# Patient Record
Sex: Female | Born: 1970 | Race: Asian | Hispanic: No | Marital: Single | Smoking: Never smoker
Health system: Southern US, Community
[De-identification: ages and names within clinical notes are randomized; demographics above are authoritative.]

## PROBLEM LIST (undated history)

## (undated) DIAGNOSIS — C569 Malignant neoplasm of unspecified ovary: Secondary | ICD-10-CM

## (undated) DIAGNOSIS — G43909 Migraine, unspecified, not intractable, without status migrainosus: Secondary | ICD-10-CM

## (undated) DIAGNOSIS — N858 Other specified noninflammatory disorders of uterus: Secondary | ICD-10-CM

## (undated) HISTORY — DX: Other specified noninflammatory disorders of uterus: N85.8

## (undated) HISTORY — PX: SALPINGECTOMY: SHX328

---

## 2008-01-03 ENCOUNTER — Ambulatory Visit: Payer: Self-pay | Admitting: *Deleted

## 2008-01-03 ENCOUNTER — Ambulatory Visit: Payer: Self-pay | Admitting: Internal Medicine

## 2008-01-04 ENCOUNTER — Ambulatory Visit (HOSPITAL_COMMUNITY): Admission: RE | Admit: 2008-01-04 | Discharge: 2008-01-04 | Payer: Self-pay | Admitting: Family Medicine

## 2008-01-12 ENCOUNTER — Ambulatory Visit (HOSPITAL_COMMUNITY): Admission: RE | Admit: 2008-01-12 | Discharge: 2008-01-12 | Payer: Self-pay | Admitting: Family Medicine

## 2008-02-12 ENCOUNTER — Ambulatory Visit: Payer: Self-pay | Admitting: Family Medicine

## 2008-02-12 LAB — CONVERTED CEMR LAB
AST: 12 units/L (ref 0–37)
Alkaline Phosphatase: 53 units/L (ref 39–117)
BUN: 13 mg/dL (ref 6–23)
Basophils Absolute: 0 10*3/uL (ref 0.0–0.1)
Basophils Relative: 1 % (ref 0–1)
Calcium: 9.1 mg/dL (ref 8.4–10.5)
Chloride: 104 meq/L (ref 96–112)
Creatinine, Ser: 0.69 mg/dL (ref 0.40–1.20)
Eosinophils Absolute: 0.1 10*3/uL (ref 0.0–0.7)
Eosinophils Relative: 2 % (ref 0–5)
HDL: 48 mg/dL (ref >39)
Hemoglobin: 11.8 g/dL — ABNORMAL LOW (ref 12.0–15.0)
MCHC: 31.6 g/dL (ref 30.0–36.0)
MCV: 84.2 fL (ref 78.0–100.0)
Monocytes Absolute: 0.3 10*3/uL (ref 0.1–1.0)
Monocytes Relative: 5 % (ref 3–12)
RBC: 4.44 M/uL (ref 3.87–5.11)
RDW: 14.5 % (ref 11.5–15.5)
TSH: 3.264 microintl units/mL (ref 0.350–4.50)
Total Bilirubin: 0.5 mg/dL (ref 0.3–1.2)
Total CHOL/HDL Ratio: 3.2
VLDL: 14 mg/dL (ref 0–40)

## 2008-02-23 ENCOUNTER — Ambulatory Visit: Payer: Self-pay | Admitting: Family Medicine

## 2008-02-23 ENCOUNTER — Encounter: Payer: Self-pay | Admitting: Family Medicine

## 2008-03-22 ENCOUNTER — Ambulatory Visit: Payer: Self-pay | Admitting: Obstetrics & Gynecology

## 2008-03-28 ENCOUNTER — Ambulatory Visit (HOSPITAL_COMMUNITY): Admission: RE | Admit: 2008-03-28 | Discharge: 2008-03-28 | Payer: Self-pay | Admitting: Obstetrics & Gynecology

## 2008-04-11 ENCOUNTER — Ambulatory Visit: Payer: Self-pay | Admitting: Obstetrics & Gynecology

## 2008-04-30 ENCOUNTER — Inpatient Hospital Stay (HOSPITAL_COMMUNITY): Admission: AD | Admit: 2008-04-30 | Discharge: 2008-05-02 | Payer: Self-pay | Admitting: Obstetrics & Gynecology

## 2008-04-30 ENCOUNTER — Ambulatory Visit: Payer: Self-pay | Admitting: Obstetrics & Gynecology

## 2008-04-30 ENCOUNTER — Encounter: Payer: Self-pay | Admitting: Obstetrics & Gynecology

## 2008-05-17 ENCOUNTER — Ambulatory Visit: Payer: Self-pay | Admitting: Obstetrics & Gynecology

## 2008-06-28 ENCOUNTER — Ambulatory Visit: Payer: Self-pay | Admitting: Obstetrics & Gynecology

## 2008-09-27 ENCOUNTER — Ambulatory Visit: Payer: Self-pay | Admitting: Obstetrics & Gynecology

## 2008-09-27 LAB — CONVERTED CEMR LAB: Chlamydia, Swab/Urine, PCR: NEGATIVE

## 2009-02-12 ENCOUNTER — Ambulatory Visit: Payer: Self-pay | Admitting: Internal Medicine

## 2009-08-07 ENCOUNTER — Ambulatory Visit: Payer: Self-pay | Admitting: Family Medicine

## 2009-08-07 IMAGING — US US RENAL
1 series · 14 of 24 positions shown · non-contrast
Comparison: None

CLINICAL DATA: Right flank pain.  Enlarged uterus.

RENAL/URINARY TRACT ULTRASOUND
TECHNIQUE: Complete ultrasound examination of the urinary tract
was performed including evaluation of the kidneys renal collecting
systems and urinary bladder.

[Series 1: unknown · 0.27mm/px · 14 of 24 slices shown]
[im 1/24]
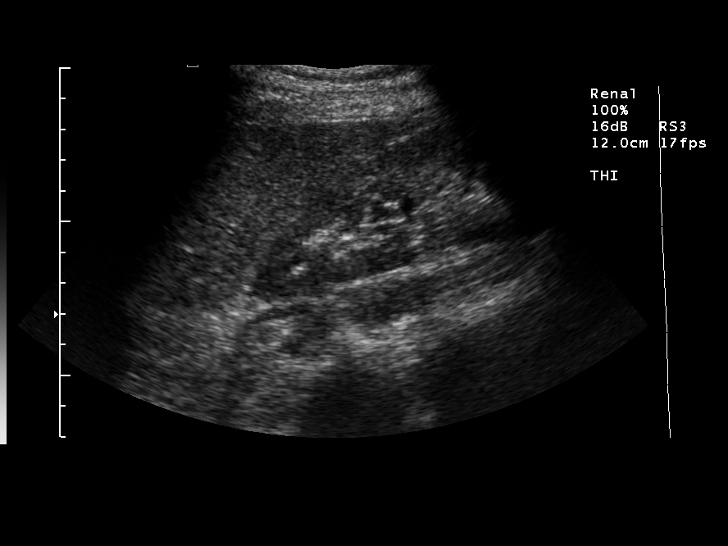
[im 3/24]
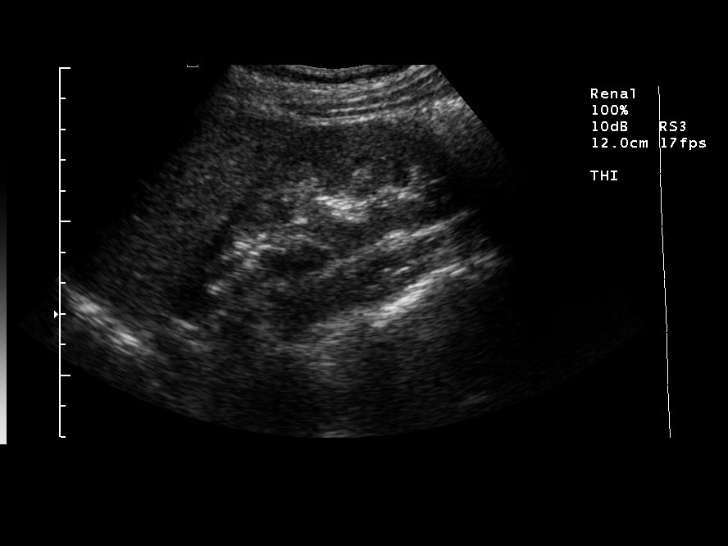
[im 5/24]
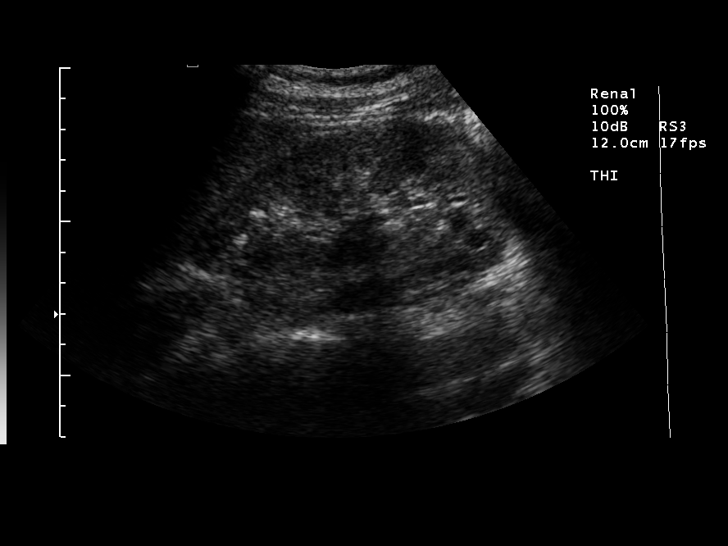
[im 7/24]
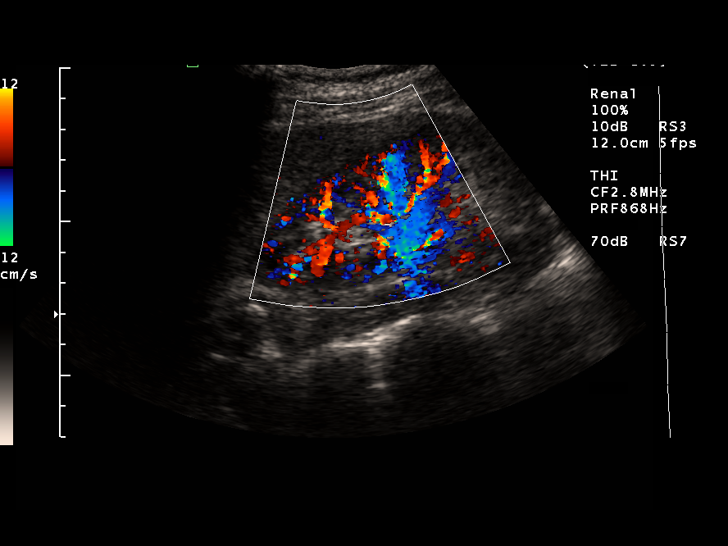
[im 8/24]
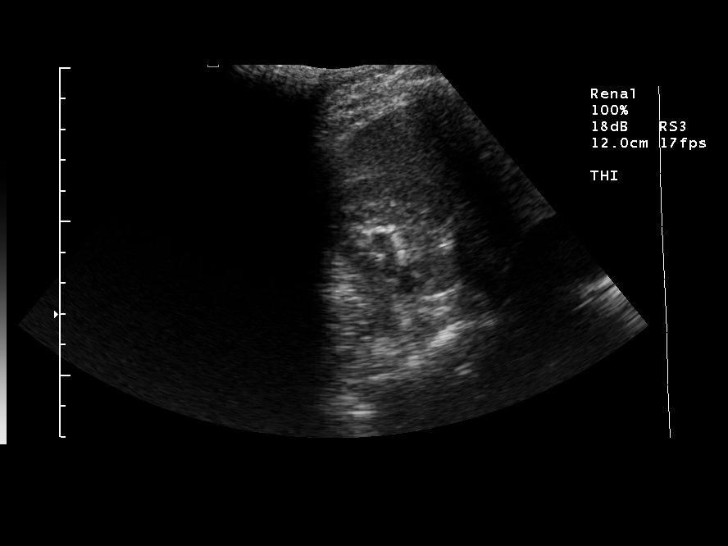
[im 10/24]
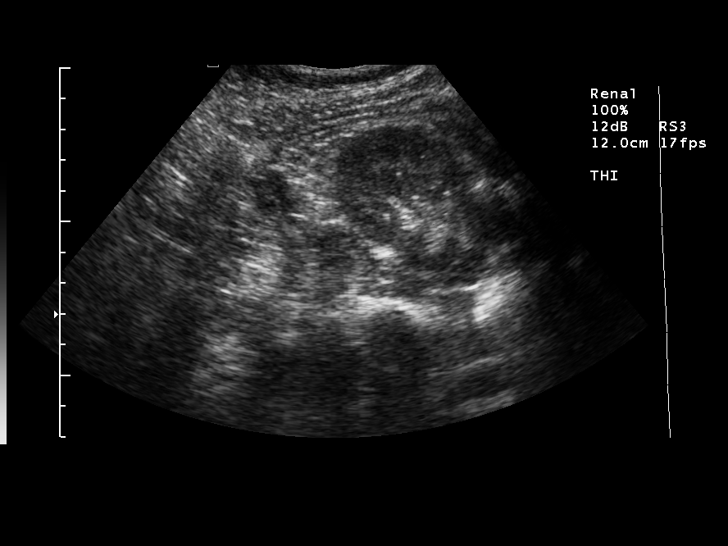
[im 12/24]
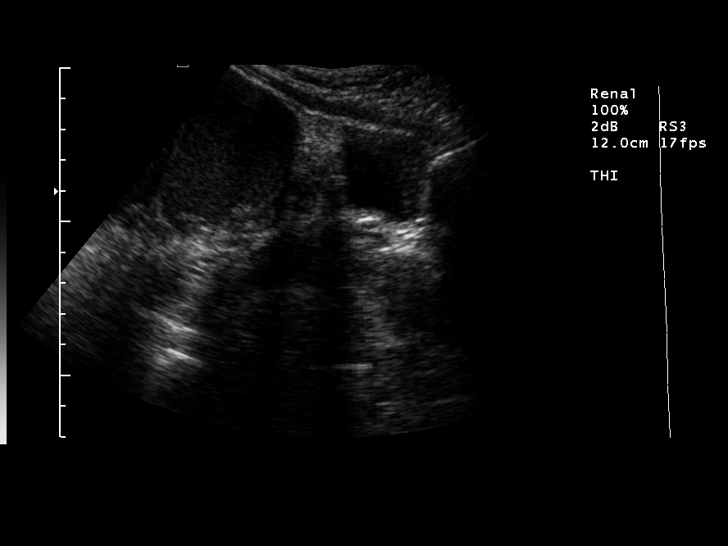
[im 13/24]
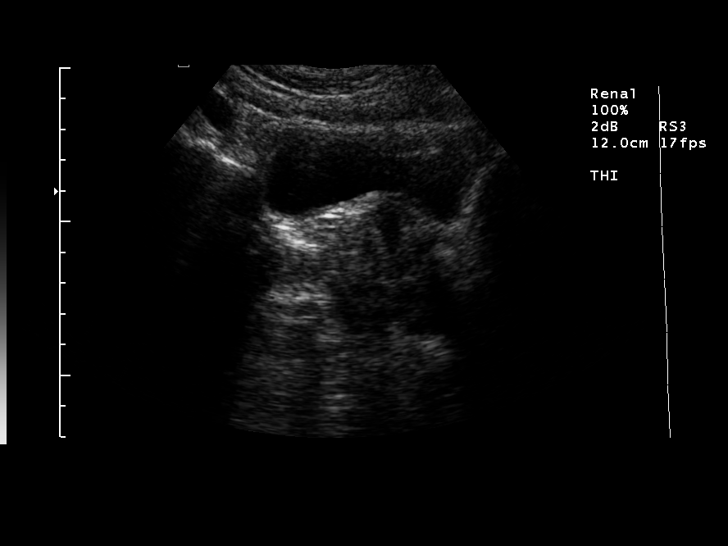
[im 15/24]
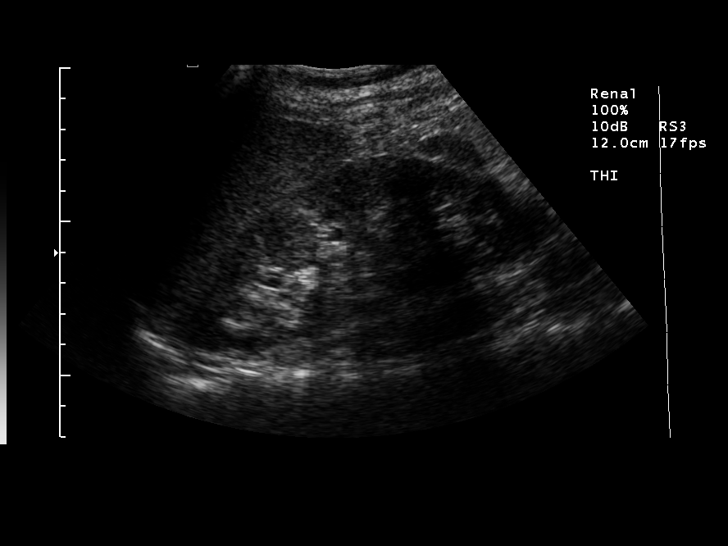
[im 17/24]
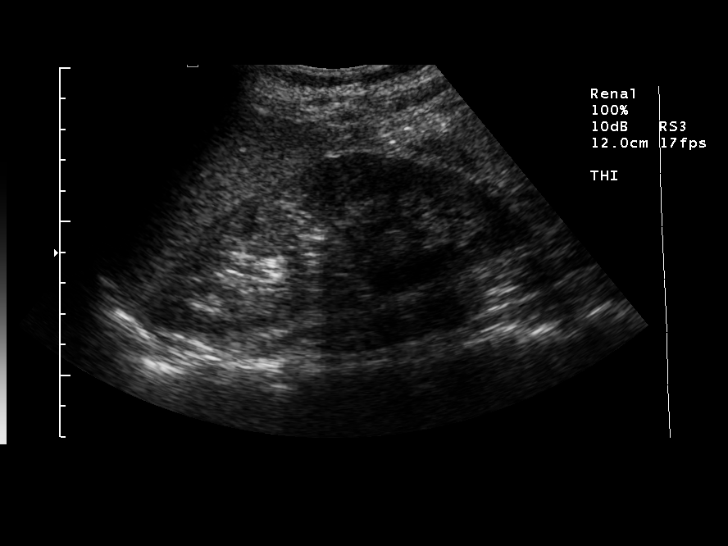
[im 19/24]
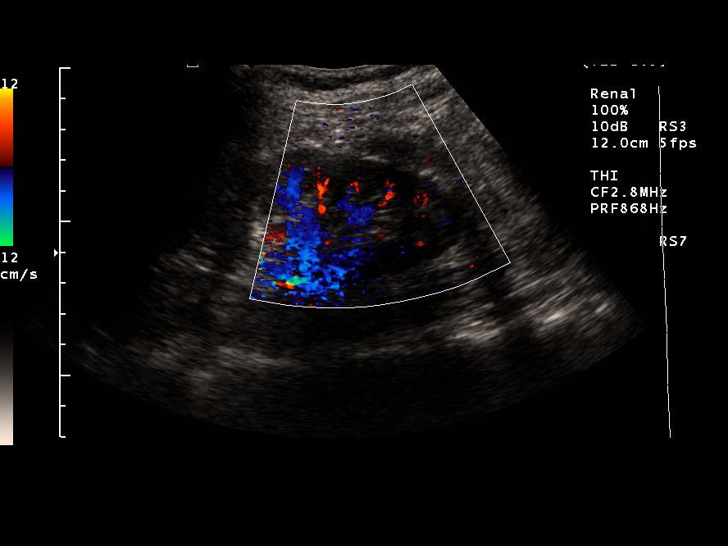
[im 20/24]
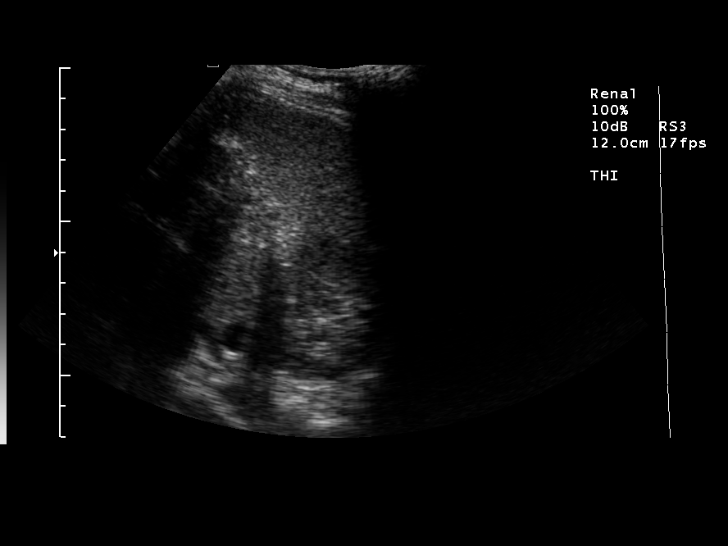
[im 22/24]
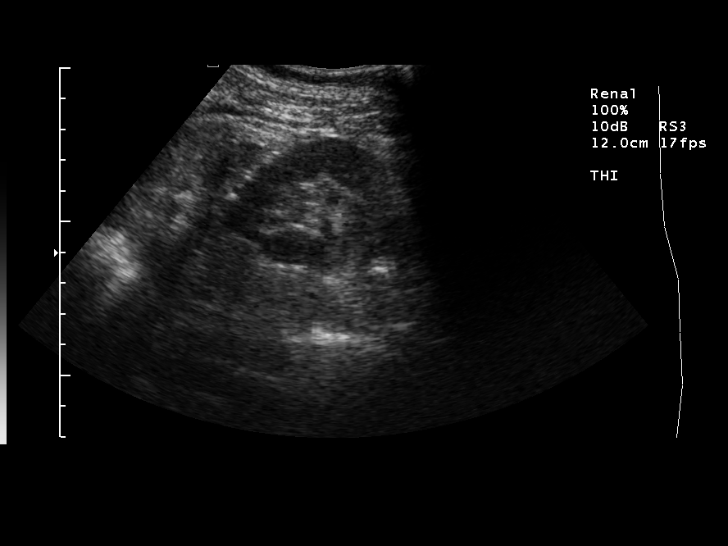
[im 24/24]
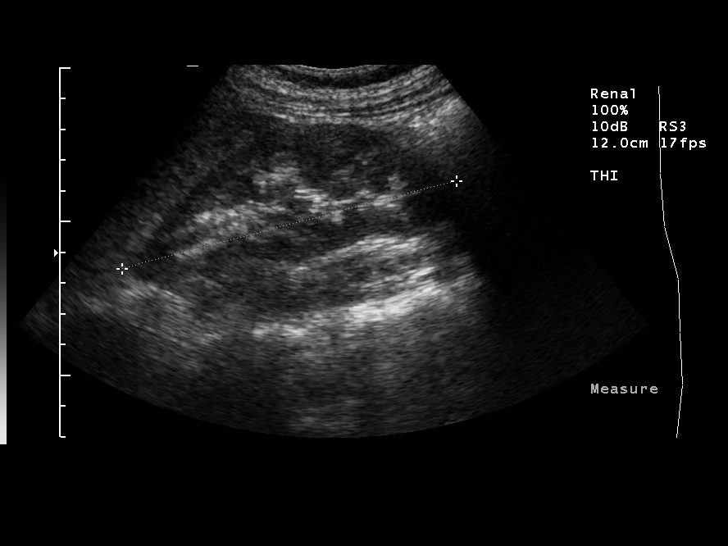

[14 of 24 positions shown; findings below may reference images not displayed]

FINDINGS: The right kidney measures 11.2 cm in length appears
normal.  There is no mass or hydronephrosis

The left kidney measures 12.6 cm in length and appears normal
without mass or hydronephrosis.  The renal cortex is normal.
Urinary bladder is partially filled and appears normal.
IMPRESSION: Negative

## 2009-08-07 IMAGING — US US PELVIS COMPLETE MODIFY
1 series · 13 of 25 positions shown · non-contrast
Comparison: None.

CLINICAL DATA: 36-year-old female with right flank pain and
clinical suspicion of enlarged uterus. LMP reported as 12/13/2007.

TRANSABDOMINAL AND TRANSVAGINAL ULTRASOUND OF PELVIS
TECHNIQUE: Both transabdominal and transvaginal ultrasound
examinations of the pelvis were performed including evaluation of
the uterus, ovaries, adnexal regions, and pelvic cul-de-sac.

[Series 1: unknown · 0.28mm/px · 13 of 86 slices shown]
[im 1/86]
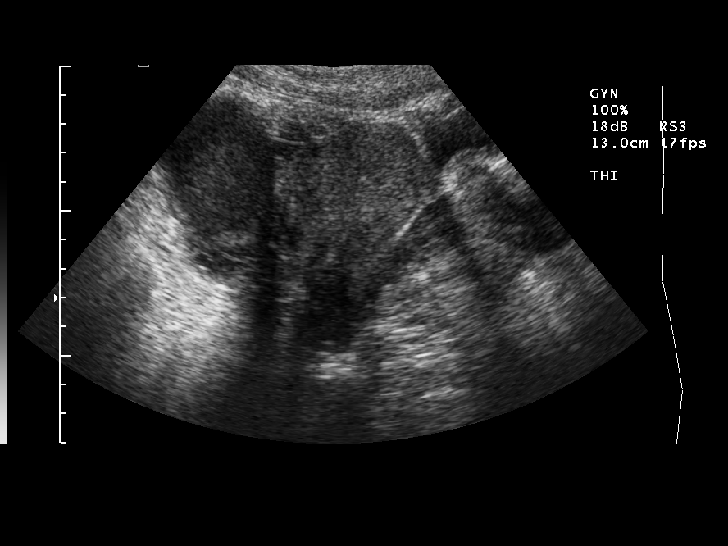
[im 8/86]
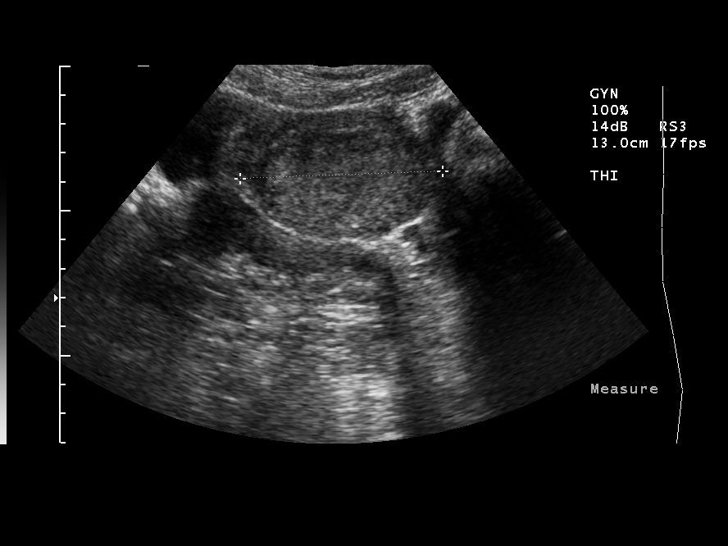
[im 15/86]
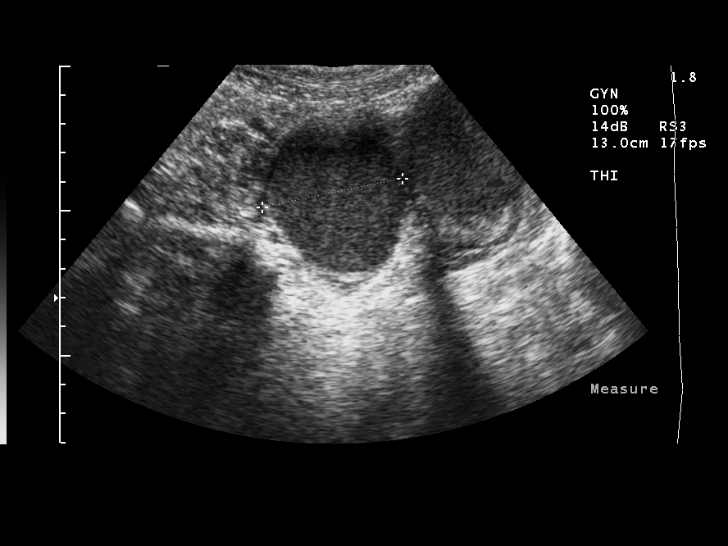
[im 22/86]
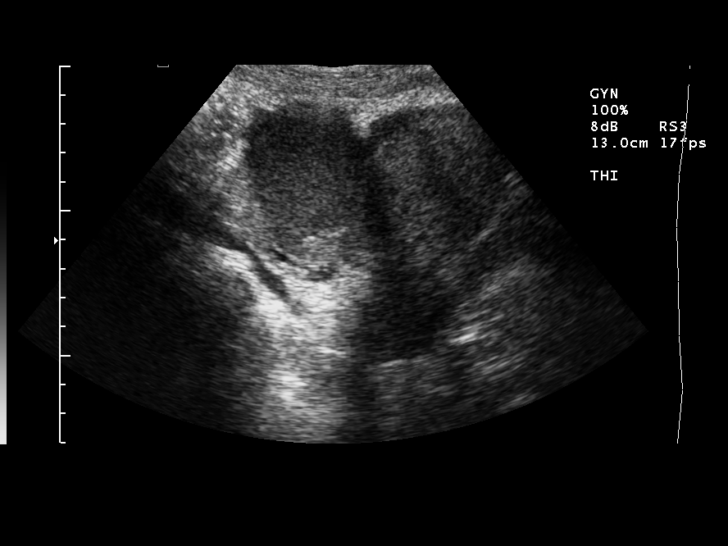
[im 29/86]
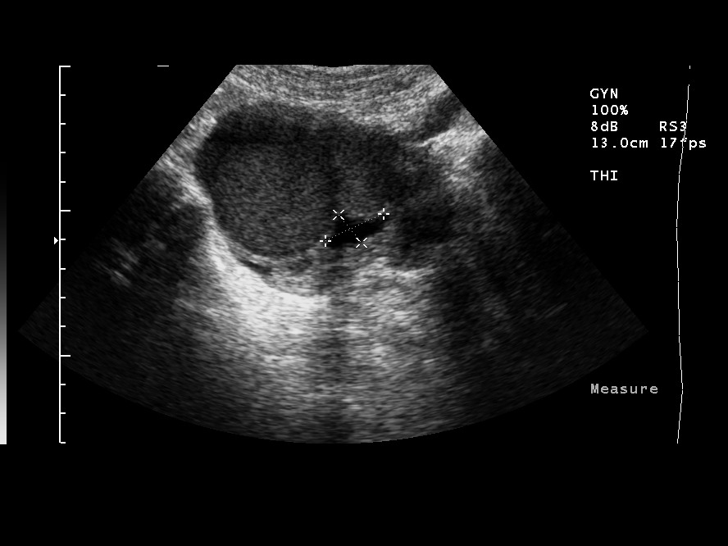
[im 36/86]
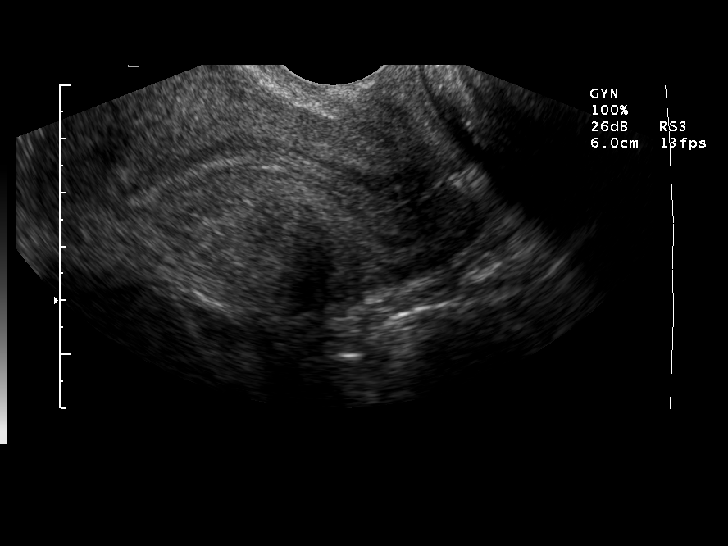
[im 43/86]
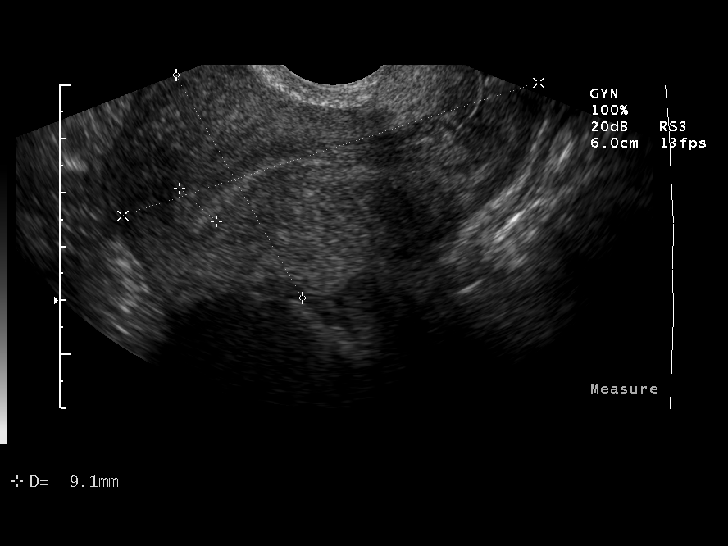
[im 50/86]
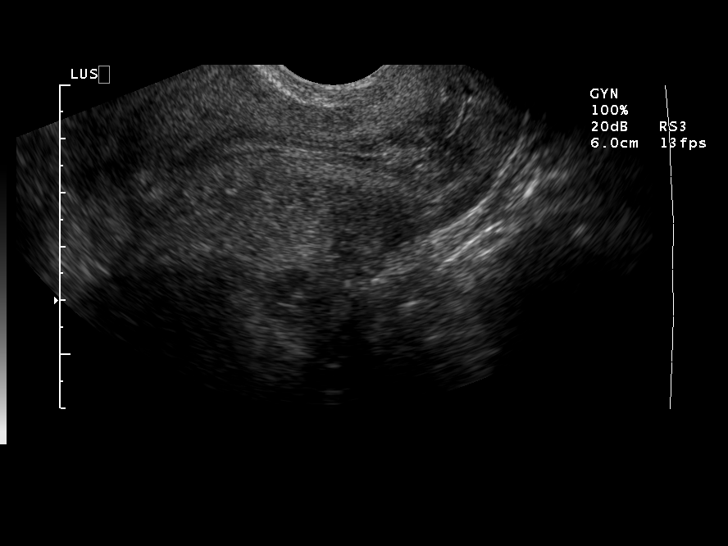
[im 57/86]
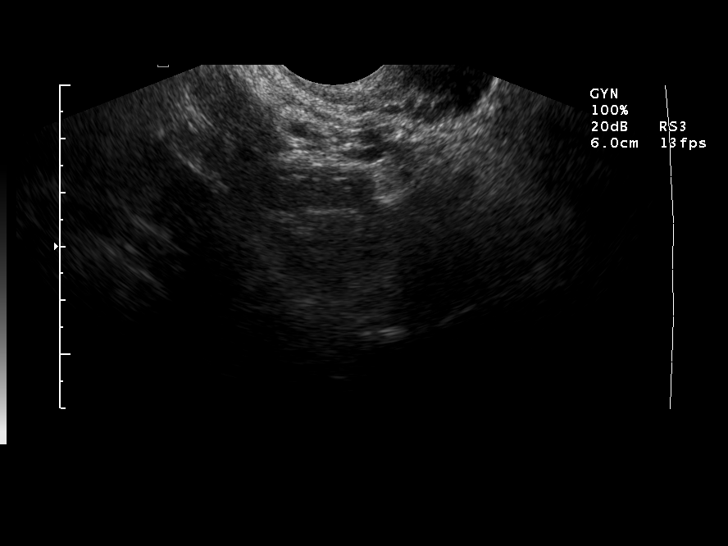
[im 64/86]
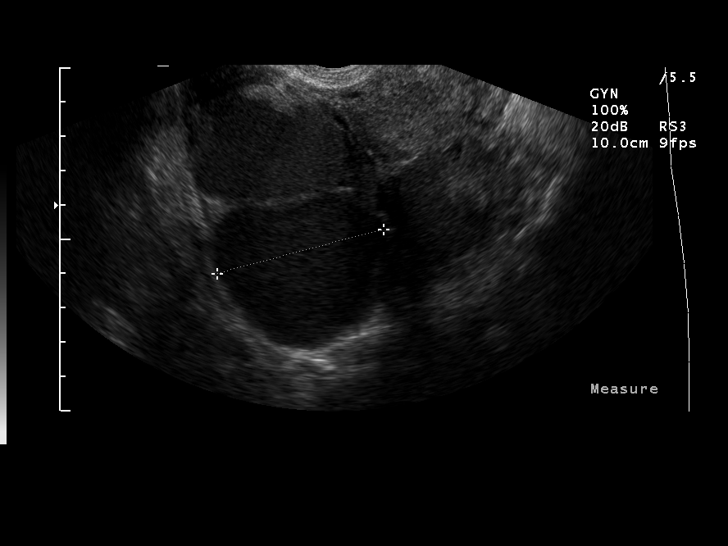
[im 71/86]
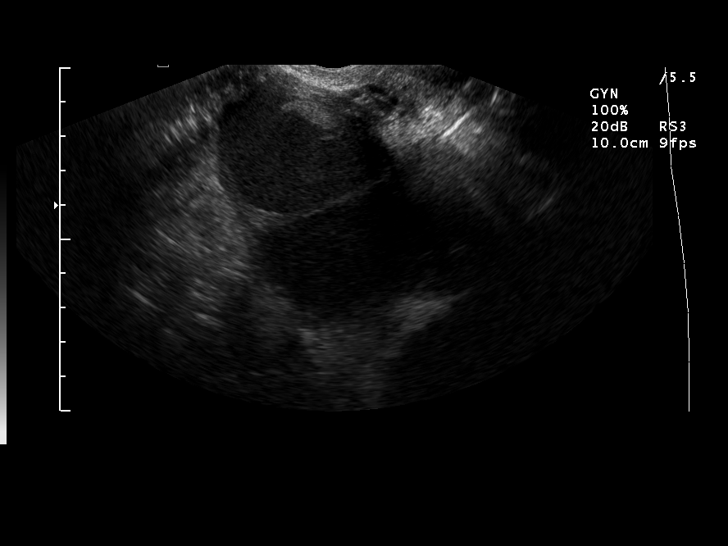
[im 78/86]
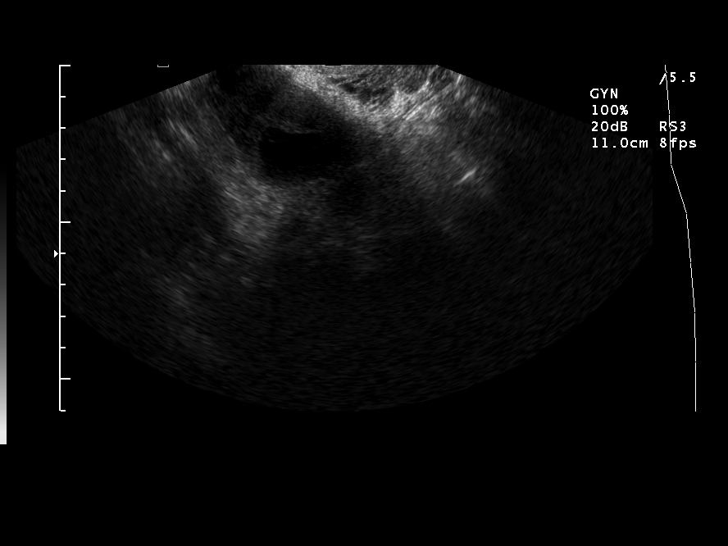
[im 86/86]
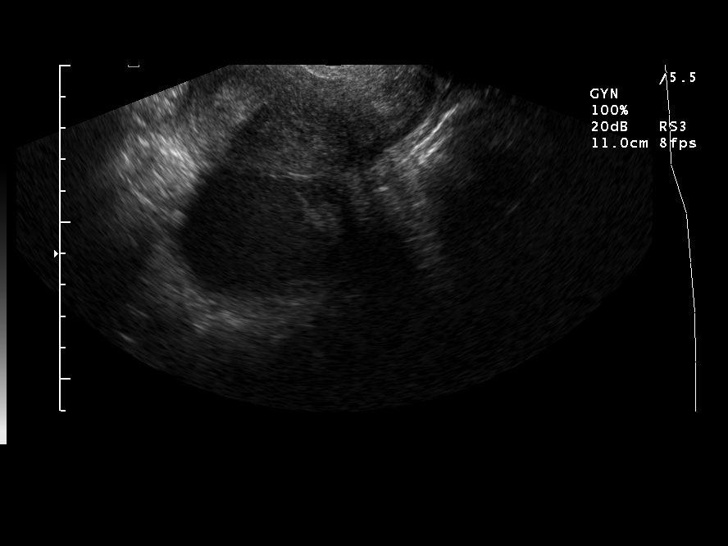

[13 of 25 positions shown; findings below may reference images not displayed]

FINDINGS: The uterus is not enlarged measuring 8.1 x 4.8 x 5.8 cm.
The myometrial echotexture is within normal limits.  The
endometrium is prominent and is seen to be thicker in the region of
the lower uterine segment than in the fundus (12 and 9 mm
respectively).

No definite free fluid identified.

The left adnexa is never delineated on transabdominal or
transvaginal scanning.

The right adnexa is very abnormal.  There are multiple rounded and
lobulated structures occupying the region of the right adnexa which
have velvety - intermediate level echotexture.  Altogether, these
measure 10.7 x 8 x 10.2 cm.  Individually, the largest of the three
fairly distinct round lesions measures 5.6 x 4 x 5.1 cm.  The
appearance is typical of chronic blood products such as is seen
with endometriomas.  However, the multilobulated appearance of
these lesions raises the possibility of involvement of the right
fallopian tube.  No normal adnexal tissue on the right is
identified.  There is a small trapezoidal shaped anechoic area
measuring 22 x 12 mm which could be some associated free fluid, or
compressed follicle. Also, there is a small curvilinear more soft
tissue appearing excrescence which is associated with the superior
most lesion.  This excrescence demonstrates no definite vascularity
on Doppler interrogation.  These masses likely correspond to the
palpable abnormality reported clinically.
IMPRESSION: 1.  Abnormal right adnexa.  The appearance is most suggestive of
multiple large endometriomas and/or endometriosis and dilatation of
the right fallopian tube.  Pelvis MRI with and without contrast (
high resolution female pelvis protocol) is recommended for further
characterization.
2.  Uterus within normal limits for age.
3.  Left adnexa is not identified.

## 2009-09-18 ENCOUNTER — Ambulatory Visit: Payer: Self-pay | Admitting: Internal Medicine

## 2010-12-01 NOTE — Op Note (Signed)
Ashley Reyes, Ashley Reyes            ACCOUNT NO.:  [id number]   MEDICAL RECORD NO.:  [id number]          PATIENT TYPE:  INP   LOCATION:  9311                          [facility]ILITY:  [facility]   PHYSICIAN:  Norton Blizzard, MD    DATE OF BIRTH:  [birth year]   DATE OF PROCEDURE:  04/30/2008  DATE OF DISCHARGE:                               OPERATIVE REPORT   PREOPERATIVE DIAGNOSES:  1. Chronic pelvic pain.  2. Persistent ovarian cysts bilaterally.   POSTOPERATIVE DIAGNOSES:  1. Chronic pelvic pain.  2. Persistent ovarian cysts bilaterally.  3. Benign ovarian cysts on frozen section.   PROCEDURES:  1. Diagnostic laparoscopy converted to exploratory laparotomy.  2. Bilateral salpingo-oophorectomy.  3. Lysis of adhesions.   SURGEON:  Norton Blizzard, MD   ASSISTANT:  Javier Glazier. Rose, MD   ANESTHESIA:  General.   IV FLUIDS:  1500 mL of lactated Ringer's.   ESTIMATED BLOOD LOSS:  100 mL.   URINE OUTPUT:  100 mL.   INDICATIONS:  The patient is a 41 year old gravida 2, para 2 who has had  persistent ovarian cysts bilaterally and severe pain with periods for  the left 6 years.  On an ultrasound and MRI, she was noted to have  significant pelvic pathology, which involved complex cysts measuring up  to 10 cm in size on her right side and up to 6 cm in size on her left  side with heterogenous components.  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  When she was initially seen in the beginning of  September, she declined surgery.  She reported using a homeopathic  therapy, which she hoped would cure her cyst, and her endometriosis.  When she came back for a followup visit in October, the patient was  ready to have surgery; she was told that given that the cysts have been  persistent for many years, there was a low risk of neoplasia; however,  this will not be known until pathologic examination. She was told that a  diagnostic laparoscopy will be done given the patient was very  interested in minimal invasive surgery; however, she was told that there  was a great chance of converting to laparotomy given the size of the  cysts noted on ultrasound.  The risks of surgery including bleeding,  infection, injury to surrounding organs, possible removal of both  ovaries, and possible laparotomy were discussed with the patient and  written informed consent was obtained.   FINDINGS:  Large bilateral complex cysts involving the entirety of both  ovaries.  These complex cysts were adherent to each other to the  bilateral pelvic sidewalls, posterior uterine surface, rectum, and  surrounding intestines.  No normal ovarian tissue was noted.  There was  diffuse endometriosis lesions that were noted in the pelvis especially  in the anterior and posterior cul-de-sacs.  Normal uterus;no uterine  lesions observed.  Frozen pathology impression was of benign ovarian  cysts, likely endometriomas.   SPECIMENS:  Bilateral fallopian tubes and ovaries.   DISPOSITION:  Specimens to Pathology.   COMPLICATIONS:  None.   PROCEDURE DETAILS:  The patient received  preoperative IV antibiotics and  had sequential compression devices placed on her lower extremities in  the preoperative area.  She was then taken to the operating room where  general anesthesia was administered and found to be adequate.  The  patient was then placed in a dorsal lithotomy position and prepped and  draped in a sterile manner.  A uterine manipulator was placed into the  patient's cervix and a Foley catheter was placed into the patient's  bladder and attached to constant gravity.  Attention was then turned to  the patient's abdomen where an infraumbilical incision was made with a  scalpel.  The Veress needle was then inserted into the peritoneum  through this incision and correct intraperitoneal placement was  confirmed with a low opening pressure of 4 mmHg upon insufflation.  The  abdomen was then insufflated with  carbon dioxide gas to a pressure of 15  mmHg.  At this point, a 10-mm trocar was then placed into the umbilical  incision into the peritoneum.  The laparoscope was then placed at this  point.  The pelvis was visualized and it was immediately noted that she  had large ovarian cysts bilaterally that were firmly adherent to each  other and to the uterus and surrounding bowel.  Given that no clear  planes could be delineated, the decision was made to convert to  laparotomy.  The abdomen was desufflated and all instruments were  removed from the patient's abdomen.  The fascial layer of the umbilical  incision was closed with 0 Vicryl, and the skin was closed with  Dermabond.  A Pfannenstiel incision was then made using a scalpel and  carried through to the underlying layer of fascia using electrocautery.  The fascia was incised in the midline and this incision was extended  bilaterally using electrocautery.  Kochers were placed in the superior  aspect of the fascial incision, and the rectus muscles were dissected  off bluntly and sharply.  A similar process was carried out on the  inferior aspect.  The rectus muscles were separated in the midline  bluntly, and the peritoneum was entered bluntly.  This peritoneal  incision was extended superiorly and inferiorly with good visualization  of the bowel and bladder.  Peritoneal washings were performed.  Attention was then turned to the patient's ovarian cysts which were  noted at this point to be very adherent to the bowel and to the uterine  posterior wall.  An attempt was made to lyse the adhesions bluntly and  both cysts were noted to rupture and a llarge amount of chocolate-  colored thick fluid was extruded into the abdomen.  The cysts were noted  to be deflated after rupture and it was easier to use blunt and sharp  dissection to get the adhesions down in between the cysts, and also from  the posterior uterine wall.  Both ovaries were carefully  examined, and  there was no residual normal tissue that was noted.  There was also a  significant amount of endometriosis involving both ovaries, posterior  and anterior cul-de-sac, pelvic sidewalls, including significant  adhesions to bowel.  The decision at this point was made to proceed with  bilateral salpingo-oophorectomy.  A clear hole was made in the broad  ligament underneath the utero-ovarian ligament and fallopian tube, and  this pedicle was clamped, cut, and doubly suture ligated bilaterally.  A  similar process was carried out on the infundibulopelvic ligaments  bilaterally, which yielded bilateral salpingo-oophorectomy.  Both  specimens were sent to frozen section for analysis.  There was a small  amount of surface bleeding from the area that the cysts were adherent to  the uterus, and this was controlled using electrocautery.  Of note, the  rectum and bowel was also noted to be firmly adherent to the posterior  uterine wall.  There was an attempt to lyse these adhesions bluntly, but  this was not successful.  Of note, the decision was made to leave the  uterus in place due to the fact that the patient was not consented for  hysterectomy and there was no preoperative discussion regarding future  fertility plans.  Th thought was that the uterus will be left in place  in case the patient desired future fertility, and she could be a  candidate for in vitro fertilization.  Furthermore,  the risk of rectal  or bowel injury with hysterectomy at this time was noted to be very  significant.  The uterus and the densely adherent bowel were left in  place.  The abdomen was copiously irrigated with normal saline and  hemostasis was confirmed on all pedicles.  The posterior surface of the  uterus continued to have diffuse punctate bleeding from the area  involved in the adhesion lysis; this was controlled by placing Surgicel  on the site, also a sheet of Interceed was also placed as an  adhesion  barrier.  Overall, good hemostasis was noted.  At this point, the report  from the frozen pathology returned with the impression of benign ovarian  cysts, likely endometriomas.  The peritoneum was then reapproximated  using 0 Vicryl in a running stitch, and the fascia was closed using 0  Vicryl also in a running stitch.  The skin was closed with staples.  The  patient tolerated the procedure well.  Sponge, needle, and instrument  counts were correct x3.  She was taken to the recovery room awake,  extubated, and in stable condition.      Norton Blizzard, MD  Electronically Signed     UAD/MEDQ  D:  04/30/2008  T:  05/01/2008  Job:  [id number]
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
## 2010-12-01 NOTE — Group Therapy Note (Signed)
NAME:  Ashley Reyes, DYKMAN NO.:  [id number]   MEDICAL RECORD NO.:  [id number]          PATIENT TYPE:  WOC   LOCATION:  [facility]                   FACILITY:  [facility]   PHYSICIAN:  Johnella Moloney, MD        DATE OF BIRTH:  12/26/1970   DATE OF SERVICE:                                  CLINIC NOTE   HISTORY:  The patient is a 40 year old para 2 who was last seen by Dr.  Elsie Lincoln on March 22, 2008 for evaluation of complex ovarian  cyst.  The patient had an MRI that showed  complex cyst bilaterally  measuring up to 10 cm in size on her right ovary and up to 6 cm in size  on the left ovary.  The patient had an ultrasound given that this MRI  was done in June and she had an ultrasound to document any interval  change.  She is here to follow up results.  The patient does not have  any pain or any other symptoms.   Ultrasound was remarkable for 3 large hypoechoic masses within the  pelvis, 2 on the right and 1 on the left, and do not appear  significantly changed in size measuring 9.9 x 8.9 x 5.7 in overall  dimension.  No free pelvic fluid.  Endometrial stripe is homogeneous  within normal limits measuring 9 mm and normal uterus.  These results  were discussed with the patient.  Given the persistence of the cysts,  the patient is interested in getting surgery.  The risks of surgery were  explained to the patient including bleeding, infection, injury  surrounding organs, possible removal of ovaries and possible laparotomy.  She was told that the mode of surgery would be operative laparoscopy,  bilateral ovarian cystectomy with possible oophorectomy and possible  laparotomy.  She was also told about anesthesia complications which will  be explained in detail by the anesthesiologist.   All questions were answered and the patient will meet with the surgical  scheduler at the end of this visit for surgery scheduling.  The patient  has a comprehensive history and physical  that was taken by Dr. Elsie Lincoln on March 22, 2008.  She does not have any significant medical  or surgical history.           ______________________________  Johnella Moloney, MD     UD/MEDQ  D:  04/11/2008  T:  04/12/2008  Job:  [id number]

## 2010-12-01 NOTE — Group Therapy Note (Signed)
NAME:  Ashley Reyes, Ashley Reyes NO.:  [id number]   MEDICAL RECORD NO.:  [id number]          PATIENT TYPE:  WOC   LOCATION:  [facility]                   [facility]AC[facility]   PHYSICIAN:  Johnella Moloney, MD        DATE OF BIRTH:  07-17-1971   DATE OF SERVICE:  05/17/2008                                  CLINIC NOTE   The patient is a 40 year old para 2 who underwent an exploratory  laparotomy, bilateral salpingo-oophorectomy, and lysis of adhesions for  large endometriomas and adhesions sustained as a result of severe  endometriosis.  The patient had an uncomplicated surgery and  postoperative course.  She is here today for her 2-week followup.  The  patient reports markedly improved pelvic pain; however, she does report  having heavier bleeding that started last night.  Of note, the patient  received a dose of Depo-Provera during her hospital day for treatment of  her endometriosis.  She also reports that she has been itching since  surgery and reports pain in her jaw and left ear that that it is  difficult to eat.  She also reports having sores in her mouth and she  has lost 6 pounds since surgery.  The patient denies any fevers, chills,  sweats, genitourinary, or gastrointestinal symptoms.   PHYSICAL EXAMINATION:  Temperature 98.7, pulse 73, blood pressure  106/68, weight 110 pounds, height 5 feet 1-3/4 inches.  In general, in  no apparent distress.  Abdomen is soft, nontender, a well-healed  Pfannenstiel incision.  Pelvic examination is deferred.   ASSESSMENT AND PLAN:  The patient is a 40 year old para 2 here for  postoperative check.  The patient is doing well.  She was told that her  bleeding is likely due to estrogen withdrawal given that her ovaries  have been removed.  She was told that hormone replacement therapy will  be initiated at her next visit in the form of probably low dose oral  contraceptive pills given her age.  As for the itching, the patient does  report  taking Percocet and has been taking since surgery.  She was told  that Percocet does have pruritus as the common side effect.  She was  told to take ibuprofen and Tylenol as needed for pain and see if this  will be enough for her pain.  As for her complaints of pain in her jaw  and left ear and mouth sores, the patient was told to follow up with her  PCP for evaluation of these problems, as this is not usual side effect  of the medication she is on or the surgery that she underwent.  The  patient will have a followup appointment in the next 4-5 weeks for her  postoperative evaluation and also initiation of hormone replacement  therapy.           ______________________________  Johnella Moloney, MD     UD/MEDQ  D:  05/17/2008  T:  05/17/2008  Job:  [id number]

## 2010-12-01 NOTE — Group Therapy Note (Signed)
NAME:  DOLORAS, TELLADO NO.:  [id number]   MEDICAL RECORD NO.:  [id number]          PATIENT TYPE:  WOC   LOCATION:  [facility]                   [facility]AC[facility]   PHYSICIAN:  Allie Bossier, MD        DATE OF BIRTH:  Sep 03, 1970   DATE OF SERVICE:  09/27/2008                                  CLINIC NOTE   IDENTIFICATION:  Ashley Reyes is a 40 year old single lady, who is G2, P2,  who is from [place], she has been here for 4 years.  She had an  exploratory laparotomy and BSO and lysis of adhesions on April 30, 2008, for endometrial illness.  Her uterus was left in situ.  She was  placed on Loestrin on June 28, 2008, for hormone replacement  therapy.  She states that she has been bleeding essentially every day  since August 19, 2008, mostly she states it is a brownish discharge.  She has no other complaints.  Her vital signs are stable.  She is  afebrile.  On exam, she has old brownish discharge in her vault.  Her  cervix appears normal.  There is no polyps or lesions.  Otherwise, the  discharge is normal.   ASSESSMENT AND PLAN:  Dysfunctional uterine bleeding on Loestrin.  I  have checked cervical cultures and a TSH today.  In addition, I have  changed her oral contraceptive pills to Lo/Ovral.  She will come back in  3 months if this pill does not alleviate her symptoms.      Allie Bossier, MD     MCD/MEDQ  D:  09/27/2008  T:  09/27/2008  Job:  [id number]

## 2010-12-01 NOTE — Discharge Summary (Signed)
Ashley Reyes, Ashley Reyes            ACCOUNT NO.:  [id number]   MEDICAL RECORD NO.:  [id number]          PATIENT TYPE:  INP   LOCATION:  9311                          [facility]ACILITY:  [facility]   PHYSICIAN:  Norton Blizzard, MD    DATE OF BIRTH:  06/12/1971   DATE OF ADMISSION:  04/30/2008  DATE OF DISCHARGE:  05/02/2008                               DISCHARGE SUMMARY   ADMISSION DIAGNOSES:  Chronic pelvic pain, persistent bilateral ovarian  cyst.   DISCHARGE DIAGNOSES:  Status post removal of both ovaries and lysis of  adhesions for endometriosis.   PROCEDURES:  Diagnostic laparoscopy converted to exploratory laparotomy,  bilateral salpingo-oophorectomy, and lysis of adhesions, April 30, 2008.   BRIEF HOSPITAL COURSE:  The patient is a 40 year old gravida 2, para 2  with a long history of chronic pelvic pain and persistent bilateral  ovarian cysts.  The patient was initially scheduled for an operative  laparoscopy given that she wanted a minimally invasive approach;  however, upon laparoscopy, it was noted that she had large ovarian cysts  bilaterally, multiple adhesions to bowel, and to the uterus, and  significant evidence of endometriosis and so the decision was made to  convert to an exploratory laparotomy for further management.  She ended  up having a bilateral salpingo-oophorectomy, extensive enterolysis and  lysis of adhesions.  For further details of this operation, please refer  to the separate dictated operative report.  The patient had an  uncomplicated postoperative course.  Of note, on postoperative day #1,  the patient received the dose of Depo-Provera 300 mg IM  to help in  treatment of her endometriosis. By postoperative day #2, her pain was  controlled on oral pain medications.  She was tolerating her regular  diet, ambulating without difficulty, voiding without difficulty, and  also had a bowel movement.  Her incisional staples were removed and  Steri-Strips placed with  Benzoin were placed. The patient was deemed  stable for discharge to home.   DISCHARGE CONDITION:  Stable.   DISCHARGE MEDICATIONS:  1. Percocet 5/325 one to two tablets p.o. q.6 h p.r.n. pain.  2. Ibuprofen 600 mg p.o. q.6 h p.r.n. pain.  3. Colace 100 mg p.o. b.i.d. p.r.n. constipation.   DISCHARGE INSTRUCTIONS:  The patient was told to avoid lifting anything  for 6 weeks.  She was told also to avoid driving while she is on  narcotic pain medications.  She was a given the routine postoperative  discharge instruction sheet that addressed activity level and diet.  The  patient has a scheduled follow-up appointment in the GYN clinic on  May 17, 2008, at 10:45 a.m. and this information was communicated to  the patient.      Norton Blizzard, MD  Electronically Signed    UAD/MEDQ  D:  05/02/2008  T:  05/03/2008  Job:  [id number]

## 2010-12-01 NOTE — Group Therapy Note (Signed)
NAME:  Ashley Reyes, Ashley Reyes NO.:  [id number]   MEDICAL RECORD NO.:  [id number]          PATIENT TYPE:  WOC   LOCATION:  [facility]                   FACILITY:  [facility]   PHYSICIAN:  Johnella Moloney, MD        DATE OF BIRTH:  [birth year]   DATE OF SERVICE:                                  CLINIC NOTE   The patient is a 40 year old para 2 who underwent an exploratory  laparotomy, bilateral salpingo-oophorectomy, and lysis of adhesions for  bilateral endometriosis and endometriomas.  The patient is here for her  6-week followup.  She had an uncomplicated surgery course.  She does  report having some pain around the right side of her incision, but no  other symptoms.   PHYSICAL EXAMINATION:  The patient is afebrile.  Vital signs are stable.  Her incision is clean and intact and has healed very well.  Abdomen is  soft, nontender.  Pelvic examination is deferred.   ASSESSMENT AND PLAN:  The patient is a 40 year old para 2 here for her 6-  week postoperative evaluation given that the patient had a bilateral  salpingo-oophorectomy.  She was told that she will need hormone  replacement therapy.  She still has her uterus in place so she would  need estrogen and progesterone.  The patient was given samples for  Loestrin 24 Fe to start as part of her hormone replacement therapy.  She  was instructed to take active pills of this.  The patient was told that  she would need to be on hormone replacement therapy until age 66 and  then she will start to be weaned off therapy at this point.  The patient  will come back in 3 months for OCPM blood pressure surveillance.  As for  her incisional pain on examination, no abnormalities were found and is  likely musculoskeletal or also nerve entrapment is a possibility.  She  says that her pain is not alleviated by ibuprofen given the nature of  her pain, suggested Flexeril to see if this will help with her pain and  a prescription for Flexeril was  given to the patient.  She was told to  call if her pain does not get better.  The patient will be going back to  work soon and she was told to come back for any further problems.  Her  annual exam is due in August 2010.           ______________________________  Johnella Moloney, MD     UD/MEDQ  D:  06/28/2008  T:  06/29/2008  Job:  [id number]

## 2011-01-21 ENCOUNTER — Ambulatory Visit (INDEPENDENT_AMBULATORY_CARE_PROVIDER_SITE_OTHER): Payer: Self-pay | Admitting: Family Medicine

## 2011-01-21 DIAGNOSIS — N920 Excessive and frequent menstruation with regular cycle: Secondary | ICD-10-CM

## 2011-01-21 DIAGNOSIS — N939 Abnormal uterine and vaginal bleeding, unspecified: Secondary | ICD-10-CM | POA: Insufficient documentation

## 2011-01-22 NOTE — Group Therapy Note (Signed)
NAMEJANNATUL, Ashley Reyes NO.:  [id number]  MEDICAL RECORD NO.:  [id number]           PATIENT TYPE:  A  LOCATION:  [facility]                   [facility]ACILITY:  [facility]  PHYSICIAN:  Tinnie Gens, MD        DATE OF BIRTH:  1971-06-11  DATE OF SERVICE:  01/21/2011                                 CLINIC NOTE  CHIEF COMPLAINT:  Abnormal uterine bleeding.  HISTORY OF PRESENT ILLNESS:  The patient is a 40 year old para 2 who was seen today for abnormal uterine bleeding.  She has a very complicated history that involved a diagnostic laparoscopy with subsequent bilateral salpingo-oophorectomy without hysterectomy secondary to endometriomas. After hysterectomy, she was surgically menopausal.  Because her hot flashes were so bad she was placed on a low-dose oral contraceptive pill.  She is having trouble with this low-dose oral contraceptive pill, however, in that it causes her frank bleeding and she happens to miss a dose.  She is interested today in endometrial ablation.  PHYSICAL EXAMINATION:  VITAL SIGNS:  Her vitals are as noted in the chart. GENERAL:  She is a well-developed, well nourished female in no acute distress. ABDOMEN:  Soft, nontender, and nondistended.  IMPRESSION:  Abnormal uterine bleeding on low-dose oral contraceptive pills for hormone replacement therapy in a patient who has no ovaries but has an intact uterus.  A very lengthy discussion was held with the patient including pictures and explanations regarding the fact that she is menopausal.  If she were to stop her birth control pills, she would have any cycles at all.  However, her hot flashes would return and she had been menopausal in her late 67s which would be early for her. Endometrial ablation is not an option for this patient or could be an option for this patient.  However, if we fail to get a certain spot on her endometrial lining, we could still have precancer or cancerous changes if she was on  unopposed estrogen alone but she would need for control hot flashes, which leads [place] back with putting her on something that contains progesterone as well if she will need for protection of her uterine lining.  I suspecting that it is the low dose of hormones that is causing her uterine bleeding issues and that changing her to the regular 135 pill, we hopefully ameliorate this problem.  An alternative would be to place a Mirena IUD which will protect her uterine lining and then stop her birth control pills altogether and put her on hormone replacement, probably of a transdermal route.  But for now, we are going to start with birth control change.  She needs one to achieve anyway, so we will put her on a generic Ovcon 135.  The plan will be for her to follow up in 2 months to see how this is working for her with the aforementioned alternatives as outlined as options if this fails.          ______________________________ Tinnie Gens, MD    TP/MEDQ  D:  01/21/2011  T:  01/22/2011  Job:  [id number]  cc:   Georganna Skeans, MD Annamary Carolin

## 2011-02-16 ENCOUNTER — Telehealth: Payer: Self-pay | Admitting: *Deleted

## 2011-02-16 NOTE — Telephone Encounter (Signed)
Spoke w/pt who stated that she has been having pain for 3 wks in her abd and back. The pain is mostly on her Rt side and goes to her back and butt- feels like stabbing pain. She started her new OCP's 2 wks ago and has not yet noticed a change in her pain. I told her that it can take some time. She has been taking ibuprofen 600 mg q6h and says that the pain is relieved only for a couple hours. I advised pt to alternate tylenol 2 tabs q6h with ibuprofen so that she is taking some med every 3 hrs. I recommended that she try this for 24 hrs. If she is not satisfied with her pain relief, she should go to [facility] for evaluation. Pt states that she was told to get follow up appt with Dr. Shawnie Pons in Sept. I told pt that she may call back for that appt to be scheduled. Pt voiced understanding of all advice and info.

## 2011-02-19 ENCOUNTER — Inpatient Hospital Stay (HOSPITAL_COMMUNITY): Payer: Self-pay

## 2011-02-19 ENCOUNTER — Encounter (HOSPITAL_COMMUNITY): Payer: Self-pay | Admitting: *Deleted

## 2011-02-19 ENCOUNTER — Inpatient Hospital Stay (HOSPITAL_COMMUNITY)
Admission: AD | Admit: 2011-02-19 | Discharge: 2011-02-19 | Disposition: A | Discharge status: Home / Self Care | Payer: Self-pay | Source: Ambulatory Visit | Attending: Obstetrics and Gynecology | Admitting: Obstetrics and Gynecology

## 2011-02-19 DIAGNOSIS — R109 Unspecified abdominal pain: Secondary | ICD-10-CM

## 2011-02-19 DIAGNOSIS — M545 Low back pain, unspecified: Secondary | ICD-10-CM | POA: Insufficient documentation

## 2011-02-19 DIAGNOSIS — R1031 Right lower quadrant pain: Secondary | ICD-10-CM | POA: Insufficient documentation

## 2011-02-19 HISTORY — DX: Migraine, unspecified, not intractable, without status migrainosus: G43.909

## 2011-02-19 HISTORY — DX: Malignant neoplasm of unspecified ovary: C56.9

## 2011-02-19 LAB — CBC
MCH: 28 pg (ref 26.0–34.0)
MCHC: 33.9 g/dL (ref 30.0–36.0)
Platelets: 217 10*3/uL (ref 150–400)
RBC: 4.5 MIL/uL (ref 3.87–5.11)

## 2011-02-19 LAB — URINALYSIS, ROUTINE W REFLEX MICROSCOPIC
Glucose, UA: NEGATIVE mg/dL
Leukocytes, UA: NEGATIVE
Protein, ur: NEGATIVE mg/dL
Specific Gravity, Urine: 1.025 (ref 1.005–1.030)
Urobilinogen, UA: 0.2 mg/dL (ref 0.0–1.0)

## 2011-02-19 LAB — URINE MICROSCOPIC-ADD ON

## 2011-02-19 MED ORDER — DICLOFENAC SODIUM 75 MG PO TBEC: 75.0000 mg | DELAYED_RELEASE_TABLET | Freq: Two times a day (BID) | ORAL | Status: AC

## 2011-02-19 NOTE — Progress Notes (Signed)
Pt states she has had lower abdominal pain that radiates to the right side of her back for 3 weeks. Pt has been seen by Dr. Shawnie Pons at the [facility]. Has had a bilateral oopherectomy .

## 2011-02-19 NOTE — ED Provider Notes (Signed)
S:  Ashley Reyes is a 40 y.o. [id number] presenting with RLQ abd pain described as "like contractions" radiating to rt low back. Onset was 02/01/11 and she has relief from her regimen of Tyl 500 alternating with ibuprofen 600 q3h, but 2-3 hr after taking the med the pain returns. SHe notes more pain at rest than when moving around. She continues to work qd and does not want to take anything sedating for her pain, just concerned about having an explanation for it. She is s/p BSO for endometriomas and was put on low dose OCP, then referred to GYN CLinic by [facility] due to VB. On 7/5//12 she was seen by Dr. Shawnie Pons and switched to continuous 1/35 OCPs. She went off the low dose pills for a week before starting the 1/35 and is into the first pack and has not bled since 01/29/11. Denies abnl vag discharge, dysparunia and declines STI testing. No urinary sx or hx of musculoskeletal strain. Nl. BMs.  No N/V/fever. Good appetite.   O: Filed Vitals:   02/19/11 1545  BP: 134/72  Pulse: 75  Temp:   Resp: 18   Gen: NAD Abd: soft, flat, BS normal, healed Pfannensteil incision, minimal RLQ tenderness, no guarding or rebound  Spec: NEFG, mucusy, blood inged discharge. Parous os clean. Bimanual: no CMT, Ut NSSP, NT  Results for orders placed during the hospital encounter of 02/19/11 (from the past 24 hour(s))  URINALYSIS, ROUTINE W REFLEX MICROSCOPIC     Status: Abnormal   Collection Time   02/19/11 11:40 AM      Component Value Range   Color, Urine YELLOW  YELLOW    Appearance CLEAR  CLEAR    Specific Gravity, Urine 1.025  1.005 - 1.030    pH 6.0  5.0 - 8.0    Glucose, UA NEGATIVE  NEGATIVE (mg/dL)   Hgb urine dipstick TRACE (*) NEGATIVE    Bilirubin Urine NEGATIVE  NEGATIVE    Ketones, ur NEGATIVE  NEGATIVE (mg/dL)   Protein, ur NEGATIVE  NEGATIVE (mg/dL)   Urobilinogen, UA 0.2  0.0 - 1.0 (mg/dL)   Nitrite NEGATIVE  NEGATIVE    Leukocytes, UA NEGATIVE  NEGATIVE   URINE MICROSCOPIC-ADD ON      Status: Abnormal   Collection Time   02/19/11 11:40 AM      Component Value Range   Squamous Epithelial / LPF FEW (*) RARE    WBC, UA 0-2  <3 (WBC/hpf)  CBC     Status: Normal   Collection Time   02/19/11 12:35 PM      Component Value Range   WBC 5.5  4.0 - 10.5 (K/uL)   RBC 4.50  3.87 - 5.11 (MIL/uL)   Hemoglobin 12.6  12.0 - 15.0 (g/dL)   HCT 65.7  84.6 - 96.2 (%)   MCV 82.7  78.0 - 100.0 (fL)   MCH 28.0  26.0 - 34.0 (pg)   MCHC 33.9  30.0 - 36.0 (g/dL)   RDW 95.2  84.1 - 32.4 (%)   Platelets 217  150 - 400 (K/uL)   Ultasound: stripe 4mm, fluid collectionn in cul de sac c/w residual endometrioma or hydrosalpinx A/P: Discussed with Dr. Jolayne Panther. Possibly pain due to surgical adhesions. Reassured pt we will continue pain management. Declined Percocet for breakthrough pain. Will try Diclofenac instead of ibuprofen and continue to monitor her sx. F/U 03/29/11 GYN Clinic.

## 2011-02-19 NOTE — ED Notes (Signed)
Pt had both ovaries removed 3 yrs ago for ovarian cancer; pt started having R lower pelvis and R lower back pain 3 weeks ago; pt saw Dr Shawnie Pons around 2 weeks and 5 days ago and was started on a new medication (baltiza); pt does have periods since her surgery and had been on cryslle before; pt has been having dizziness and weak feeling for past 2 days;

## 2011-02-24 NOTE — ED Provider Notes (Signed)
Agree with above note.  Ashley Reyes 02/24/2011 4:45 PM

## 2011-03-29 ENCOUNTER — Ambulatory Visit: Payer: Self-pay | Admitting: Obstetrics & Gynecology

## 2011-04-20 LAB — CBC
HCT: 30 — ABNORMAL LOW
Hemoglobin: 10.1 — ABNORMAL LOW
MCHC: 33.5
MCHC: 33.7
MCV: 81.1
Platelets: 209
Platelets: 237
RDW: 13.1
RDW: 13.7
WBC: 7.5

## 2011-04-20 LAB — PREGNANCY, URINE: Preg Test, Ur: NEGATIVE

## 2011-04-20 LAB — TYPE AND SCREEN: ABO/RH(D): B POS

## 2011-04-29 ENCOUNTER — Ambulatory Visit: Payer: Self-pay | Admitting: Obstetrics & Gynecology

## 2011-05-03 ENCOUNTER — Encounter: Payer: Self-pay | Admitting: Obstetrics and Gynecology

## 2011-05-03 DIAGNOSIS — N939 Abnormal uterine and vaginal bleeding, unspecified: Secondary | ICD-10-CM

## 2012-01-21 ENCOUNTER — Other Ambulatory Visit: Payer: Self-pay | Admitting: Family Medicine

## 2012-09-22 IMAGING — US US PELVIS COMPLETE
1 series · 14 of 25 positions shown · non-contrast
Comparison: March 28, 2008

CLINICAL DATA: Dysfunctional uterine bleeding and pain.

TRANSABDOMINAL AND TRANSVAGINAL ULTRASOUND OF PELVIS
TECHNIQUE: Both transabdominal and transvaginal ultrasound
examinations of the pelvis were performed. Transabdominal technique
was performed for global imaging of the pelvis including uterus,
ovaries, adnexal regions, and pelvic cul-de-sac.

[Series 1: us pelvis complete · 50 acquisitions, 14 frames shown]
[im 1/50]
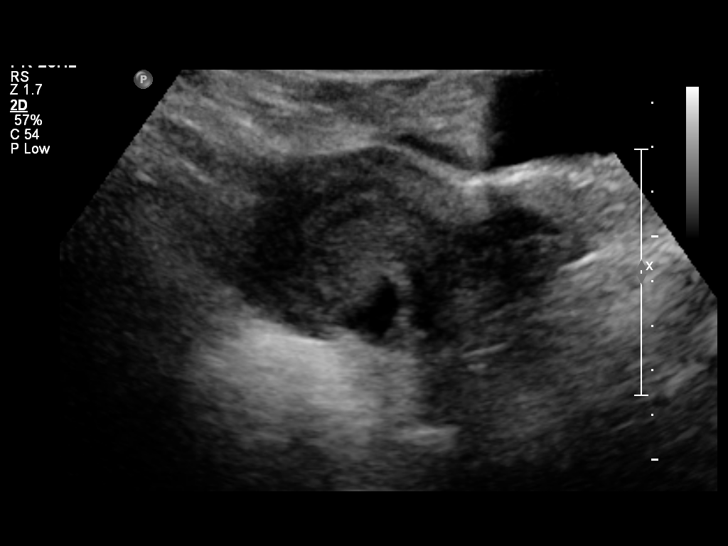
[im 5/50]
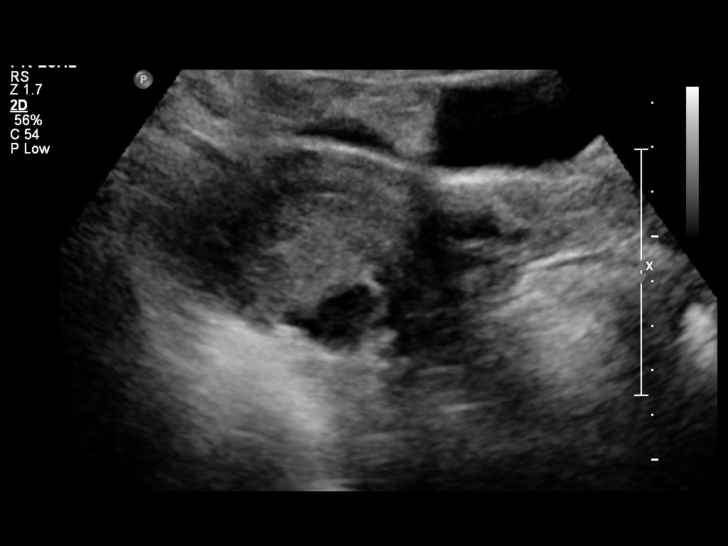
[im 9/50]
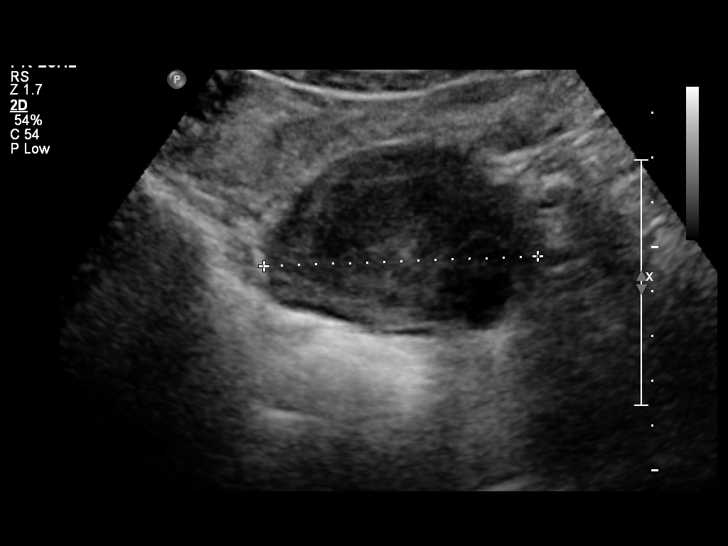
[im 13/50]
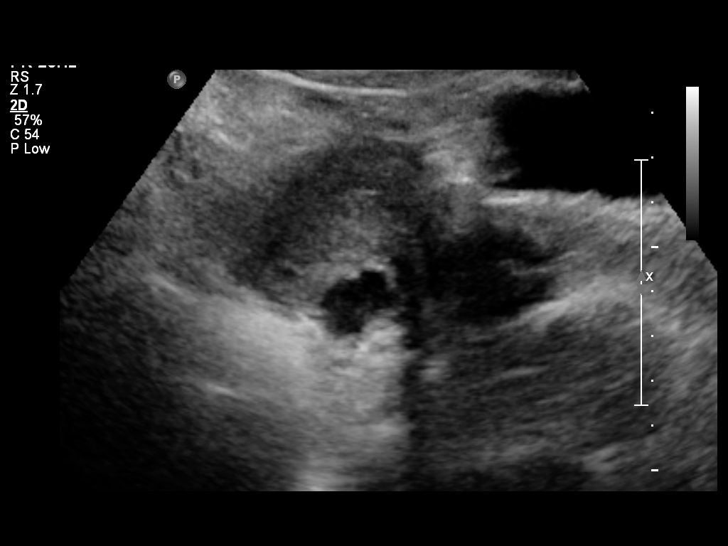
[im 17/50]
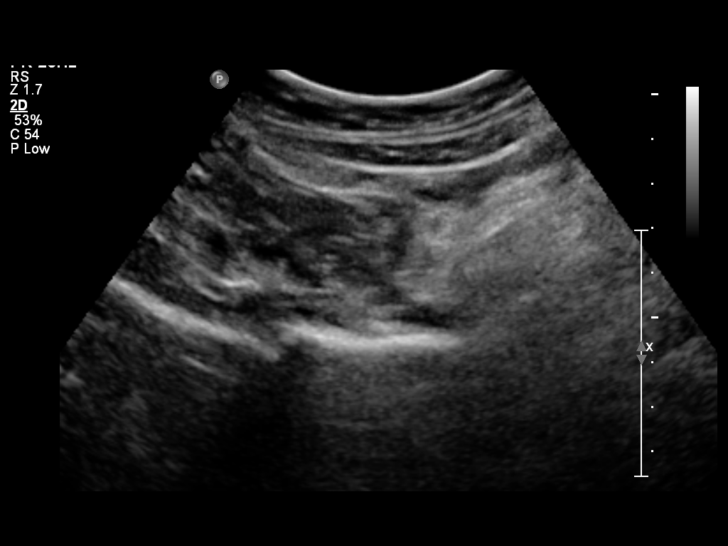
[im 19/50]
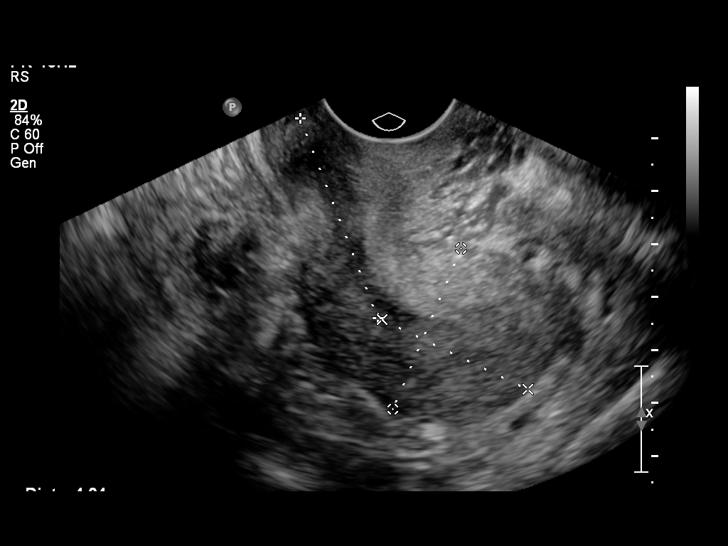
[im 23/50]
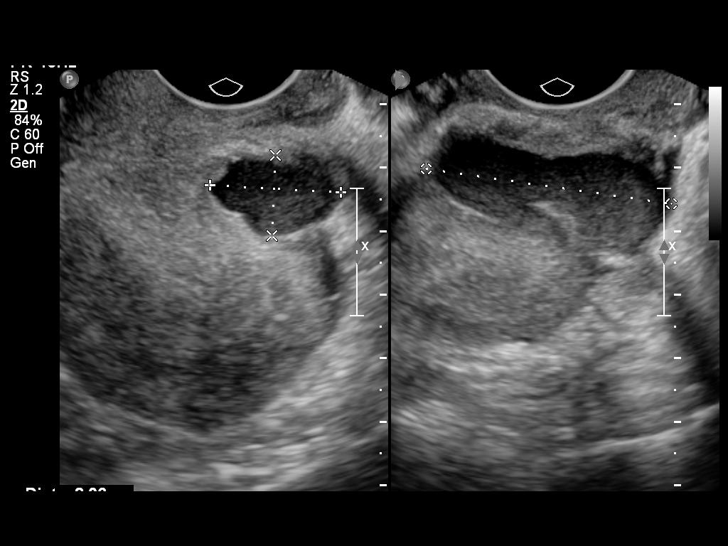
[im 27/50]
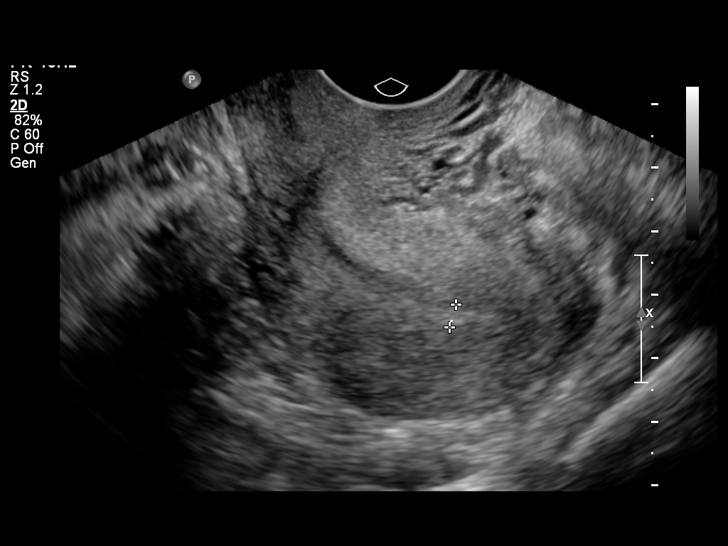
[im 31/50]
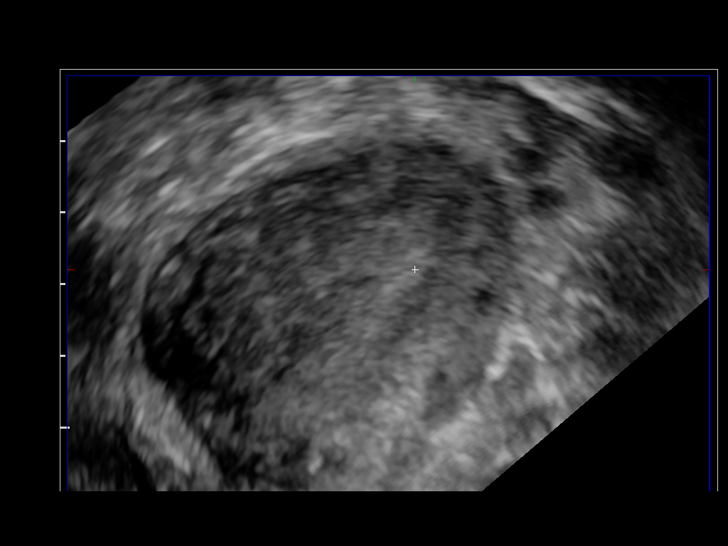
[im 33/50]
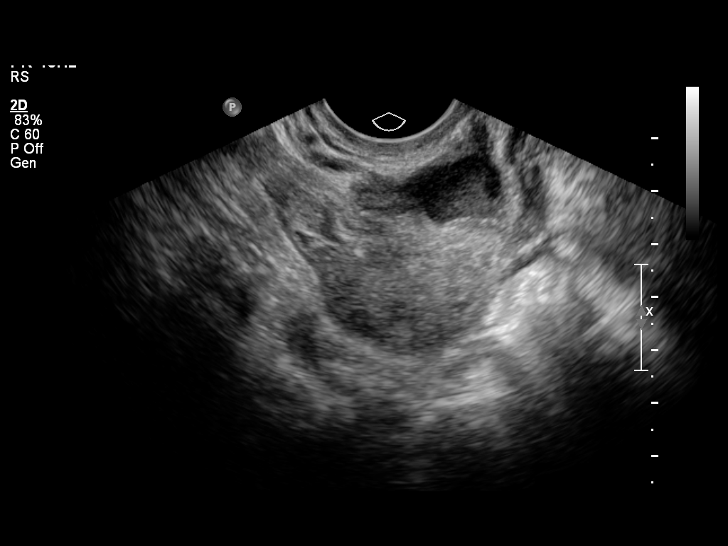
[im 37/50]
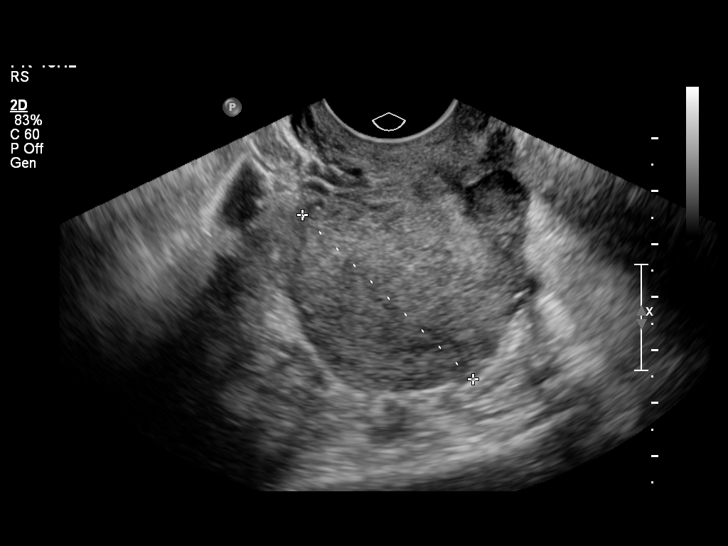
[im 41/50]
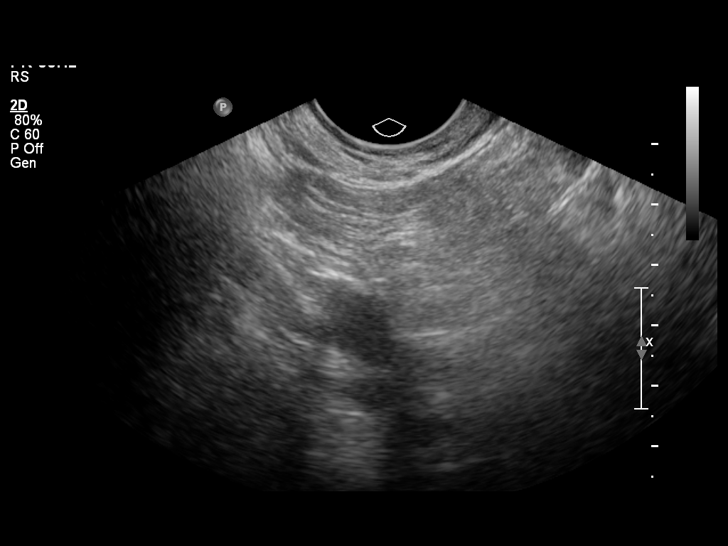
[im 45/50]
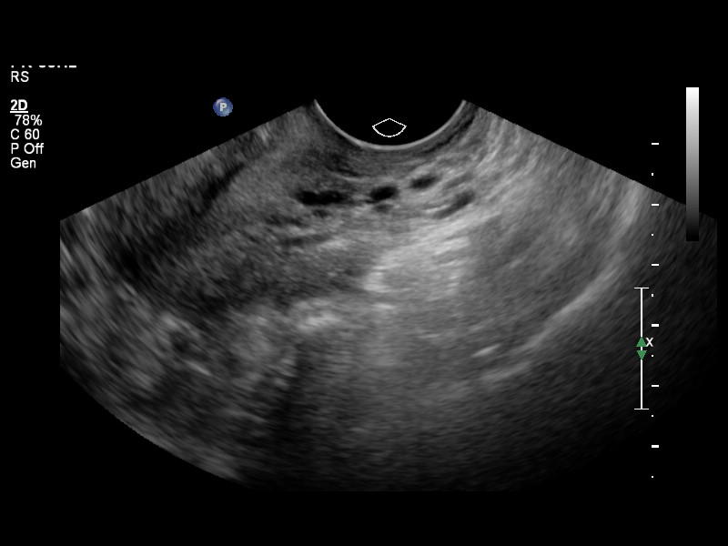
[im 50/50]
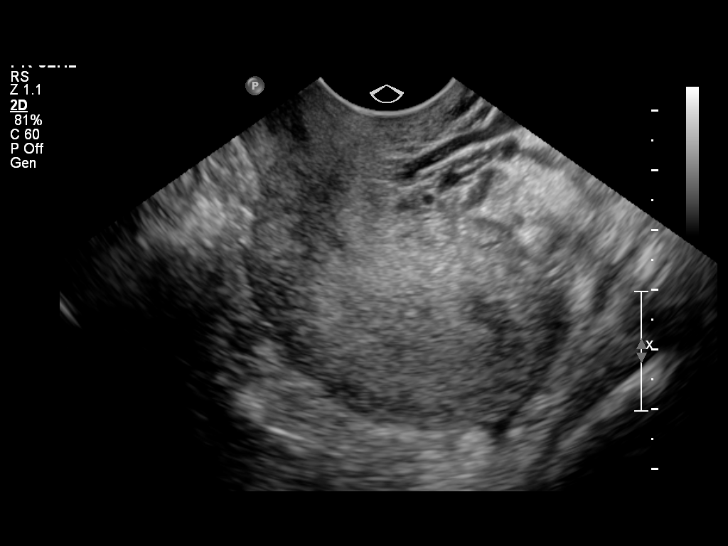

[14 of 25 positions shown; findings below may reference images not displayed]

It was necessary to proceed with endovaginal exam following the
transabdominal exam to visualize the endometrium.
FINDINGS: Both ovaries have been surgically excised. There is a
bilobed, homogeneous, complex fluid collection in the cul-de-sac
that measures 2.1 x 1.3 x 3.9 cm and may represent a residual
endometrioma or hydrosalpinx.  The large adnexal endometriomas
which were previously identified are no longer present.  There is
no free pelvic fluid.

The uterus is normal in size and echotexture, measuring 7.1 x 3.3 x
4.5 cm.  Endometrial stripe is thin and homogeneous, measuring 4 mm
in width.
IMPRESSION: Normal endometrial stripe.

Homogeneous complex fluid collection in the cul-de-sac may
represent residual endometrioma or hydrosalpinx.

## 2014-05-20 ENCOUNTER — Encounter: Payer: Self-pay | Admitting: Obstetrics and Gynecology

## 2016-04-23 ENCOUNTER — Emergency Department (HOSPITAL_COMMUNITY)
Admission: EM | Admit: 2016-04-23 | Discharge: 2016-04-23 | Disposition: A | Discharge status: Home / Self Care | Payer: Self-pay | Attending: Emergency Medicine | Admitting: Emergency Medicine

## 2016-04-23 ENCOUNTER — Encounter (HOSPITAL_COMMUNITY): Payer: Self-pay | Admitting: Emergency Medicine

## 2016-04-23 DIAGNOSIS — F410 Panic disorder [episodic paroxysmal anxiety] without agoraphobia: Secondary | ICD-10-CM

## 2016-04-23 DIAGNOSIS — F419 Anxiety disorder, unspecified: Secondary | ICD-10-CM | POA: Insufficient documentation

## 2016-04-23 DIAGNOSIS — Z8540 Personal history of malignant neoplasm of unspecified female genital organ: Secondary | ICD-10-CM | POA: Insufficient documentation

## 2016-04-23 LAB — URINALYSIS, ROUTINE W REFLEX MICROSCOPIC
BILIRUBIN URINE: NEGATIVE
Glucose, UA: NEGATIVE mg/dL
KETONES UR: 15 mg/dL — AB
NITRITE: NEGATIVE
PROTEIN: NEGATIVE mg/dL
Specific Gravity, Urine: 1.02 (ref 1.005–1.030)
pH: 6 (ref 5.0–8.0)

## 2016-04-23 LAB — COMPREHENSIVE METABOLIC PANEL
ALK PHOS: 45 U/L (ref 38–126)
ALT: 12 U/L — AB (ref 14–54)
ANION GAP: 10 (ref 5–15)
AST: 13 U/L — ABNORMAL LOW (ref 15–41)
Albumin: 3.7 g/dL (ref 3.5–5.0)
BUN: 13 mg/dL (ref 6–20)
CALCIUM: 8.9 mg/dL (ref 8.9–10.3)
CO2: 22 mmol/L (ref 22–32)
CREATININE: 0.72 mg/dL (ref 0.44–1.00)
Chloride: 105 mmol/L (ref 101–111)
Glucose, Bld: 100 mg/dL — ABNORMAL HIGH (ref 65–99)
Potassium: 4.1 mmol/L (ref 3.5–5.1)
Sodium: 137 mmol/L (ref 135–145)
Total Bilirubin: 0.9 mg/dL (ref 0.3–1.2)
Total Protein: 7.4 g/dL (ref 6.5–8.1)

## 2016-04-23 LAB — CBC WITH DIFFERENTIAL/PLATELET
Basophils Absolute: 0 10*3/uL (ref 0.0–0.1)
Basophils Relative: 0 %
EOS ABS: 0 10*3/uL (ref 0.0–0.7)
EOS PCT: 0 %
HCT: 38.8 % (ref 36.0–46.0)
Hemoglobin: 12.6 g/dL (ref 12.0–15.0)
LYMPHS ABS: 0.6 10*3/uL — AB (ref 0.7–4.0)
LYMPHS PCT: 8 %
MCH: 27.5 pg (ref 26.0–34.0)
MCHC: 32.5 g/dL (ref 30.0–36.0)
MCV: 84.5 fL (ref 78.0–100.0)
MONOS PCT: 4 %
Monocytes Absolute: 0.3 10*3/uL (ref 0.1–1.0)
Neutro Abs: 6.7 10*3/uL (ref 1.7–7.7)
Neutrophils Relative %: 88 %
PLATELETS: 254 10*3/uL (ref 150–400)
RBC: 4.59 MIL/uL (ref 3.87–5.11)
RDW: 13.6 % (ref 11.5–15.5)
WBC: 7.6 10*3/uL (ref 4.0–10.5)

## 2016-04-23 LAB — URINE MICROSCOPIC-ADD ON: RBC / HPF: NONE SEEN RBC/hpf (ref 0–5)

## 2016-04-23 MED ORDER — HYDROXYZINE HCL 25 MG PO TABS: 25.0000 mg | ORAL_TABLET | Freq: Three times a day (TID) | ORAL | 0 refills | Status: DC | PRN

## 2016-04-23 NOTE — Progress Notes (Signed)
CSW engaged with Patient and her family at Patient's bedside with Patient's permission. Patient's family reports needing financial assistance for today's visit to the ED. CSW provided Patient and family with the [employer] and the [facility]. CSW also provided Patient with the [employer] and the Eligibility and Enrollment Specialist/Patient Navigator's contact information for the [employer]. CSW provided Patient and family with resources for [facility] as well. Patient and family very appreciative of resources provided. CSW signing off. Please contact if new need(s) arise.          Emiliano Dyer, LCSW [facility] ED/6M Clinical Social Worker [phone]

## 2016-04-23 NOTE — ED Triage Notes (Signed)
Patient arrives with complaint of anxiety attacks. States onset of acute attacks last night. Husband states patient was in bathroom at time of onset of both anxiety attacks. Patient explains that she was sitting on toilet when she began to experience numbness and tingling in arms, lightheadedness, dizziness, blurred vision, hot flashes, abdominal discomfort, and dystonia. Endorses history of similar attacks, but not recently.

## 2016-04-23 NOTE — ED Notes (Signed)
Pt. oob to the bathroom, gait steady.  

## 2016-04-23 NOTE — ED Provider Notes (Signed)
[facility] DEPT Provider Note   CSN: [id number] Arrival date & time: 04/23/16  0555     History   Chief Complaint Chief Complaint  Patient presents with  . Anxiety    HPI Ashley Reyes is a 45 y.o. female.  HPI   45 year old female with history of ovarian cancer on remission, migraine headache presenting with anxiety symptoms. Patient states she has been having increased anxiety lately, and correlated to increase in stress both at home and at work. She mentioned she has anxiety attacks on occasion. Last night she experienced symptoms of anxiety attack described as having abdominal discomfort, blurred vision, tingling sensation to her extremities and "a ball of tightness in my abdomen".  Her sxs worsen while she was sitting on the commode in the bathroom trying to have a BM.  Report having loose stool.  She went back to bed and sxs got better.  This AM she report having another similar episode which concerns her. Both episodes happen while she was in the bathroom and sxs includes hot flashes, abdominal discomfort, blurry vision, dizziness and tinglings in arms.  Pt sts she rarely have back to back "anxiety attack".  While waiting in the ER her sxs improves.  She is back to her baseline.  Patient denies fever, chills, headache, lightheadedness, chest pain, shortness of breath, abdominal pain, dysuria, hematuria, vaginal bleeding, vaginal discharge, constipation. She denies SI or HI. She denies using alcohol or street drugs. At this time she reported having an overall to sensation to the left side of her body which has been waxing waning for the past 2 days.   Past Medical History:  Diagnosis Date  . Cyst of uterus   . Migraine headache   . Ovarian cancer     Patient Active Problem List   Diagnosis Date Noted  . Abnormal uterine bleeding 01/21/2011    Past Surgical History:  Procedure Laterality Date  . SALPINGECTOMY      OB History    Gravida Para Term Preterm AB Living     2 2 2     2    SAB TAB Ectopic Multiple Live Births                   Home Medications    Prior to Admission medications   Medication Sig Start Date End Date Taking? Authorizing Provider  acetaminophen (TYLENOL) 500 MG tablet Take 500 mg by mouth every 6 (six) hours as needed. For pain.     Historical Provider, MD  Hulda Humphrey 0.4-35 MG-MCG tablet take 1 tablet by mouth once daily 01/21/12   Donnamae Jude, MD    Family History Family History  Problem Relation Age of Onset  . Hypertension Mother     Social History Social History  Substance Use Topics  . Smoking status: Never Smoker  . Smokeless tobacco: Not on file  . Alcohol use No     Allergies   Review of patient's allergies indicates no known allergies.   Review of Systems Review of Systems  All other systems reviewed and are negative.    Physical Exam Updated Vital Signs BP 105/74 (BP Location: Right Arm)   Pulse 92   Temp 98.6 F (37 C) (Oral)   Resp 18   Ht 5\' 1"  (1.549 m)   Wt 60.3 kg   SpO2 99%   BMI 25.13 kg/m   Physical Exam  Constitutional: She is oriented to person, place, and time. She appears well-developed and well-nourished. No distress.  HENT:  Head: Atraumatic.  Eyes: Conjunctivae are normal.  Neck: Neck supple.  Cardiovascular: Normal rate and regular rhythm.   Pulmonary/Chest: Effort normal and breath sounds normal.  Abdominal: Soft. Bowel sounds are normal. There is no tenderness.  Neurological: She is alert and oriented to person, place, and time. She has normal strength. A sensory deficit (subjective decreased sensation to L arm with normal grip strength and no pronator drift) is present. No cranial nerve deficit. She displays a negative Romberg sign. Coordination normal. GCS eye subscore is 4. GCS verbal subscore is 5. GCS motor subscore is 6.  Skin: No rash noted.  Psychiatric: She has a normal mood and affect.  Nursing note and vitals reviewed.    ED Treatments / Results   Labs (all labs ordered are listed, but only abnormal results are displayed) Labs Reviewed  CBC WITH DIFFERENTIAL/PLATELET - Abnormal; Notable for the following:       Result Value   Lymphs Abs 0.6 (*)    All other components within normal limits  COMPREHENSIVE METABOLIC PANEL - Abnormal; Notable for the following:    Glucose, Bld 100 (*)    AST 13 (*)    ALT 12 (*)    All other components within normal limits  URINALYSIS, ROUTINE W REFLEX MICROSCOPIC (NOT AT [facility]) - Abnormal; Notable for the following:    APPearance CLOUDY (*)    Hgb urine dipstick TRACE (*)    Ketones, ur 15 (*)    Leukocytes, UA SMALL (*)    All other components within normal limits  URINE MICROSCOPIC-ADD ON - Abnormal; Notable for the following:    Squamous Epithelial / LPF 6-30 (*)    Bacteria, UA RARE (*)    All other components within normal limits    EKG  EKG Interpretation  Date/Time:  Friday April 23 2016 06:30:03 EDT Ventricular Rate:  89 PR Interval:    QRS Duration: 76 QT Interval:  359 QTC Calculation: 437 R Axis:   56 Text Interpretation:  Sinus rhythm Minimal ST elevation, anterior leads Baseline wander in lead(s) V4 No previous tracing Confirmed by POLLINA  MD, CHRISTOPHER [id number]) on 04/23/2016 6:36:00 AM       Radiology No results found.  Procedures Procedures (including critical care time)  Medications Ordered in ED Medications - No data to display   Initial Impression / Assessment and Plan / ED Course  I have reviewed the triage vital signs and the nursing notes.  Pertinent labs & imaging results that were available during my care of the patient were reviewed by me and considered in my medical decision making (see chart for details).  Clinical Course    BP 101/67   Pulse 78   Temp 98.6 F (37 C) (Oral)   Resp 17   Ht 5\' 1"  (1.549 m)   Wt 60.3 kg   SpO2 99%   BMI 25.13 kg/m    Final Clinical Impressions(s) / ED Diagnoses   Final diagnoses:  Anxiety attack     New Prescriptions New Prescriptions   HYDROXYZINE (ATARAX/VISTARIL) 25 MG TABLET    Take 1 tablet (25 mg total) by mouth every 8 (eight) hours as needed for anxiety.   Patient presents with recurrent anxiety attack, 2 episodes since last night. She is back to her normal baseline. She does report some mildly decreased sensation to her left arm and left side of body. She has normal strength and intact distal pulses.  At this time I have low suspicion  for an acute stroke. PERC negative, doubt PE.  Workup initiated.   9:21 AM EKG without any abnormal arrhythmia or acute ischemic changes. Normal H&H no signs of anemia, electrolyte panels are reassuring, UA without signs of urinary tract infection, and patient has normal orthostatic vital sign. The symptom has since resolved. I have low suspicion for acute emergent medical condition I suspect her sxs is 2/2 anxiety.  Pt has been monitored in the ED for 3 hrs.  Family request social work to help with hospital cost.  I have consulted our SW, Caryl Pina who will talk with pt and family to provide further assistance if appropriate.  Pt to f/u with PCP for further care. Return precaution discussed.     Domenic Moras, PA-C 04/23/16 [place], MD 04/24/16 [id number]

## 2021-06-03 ENCOUNTER — Other Ambulatory Visit: Payer: Self-pay

## 2021-06-03 DIAGNOSIS — N644 Mastodynia: Secondary | ICD-10-CM

## 2021-06-30 ENCOUNTER — Ambulatory Visit
Admission: RE | Admit: 2021-06-30 | Discharge: 2021-06-30 | Disposition: A | Discharge status: Home / Self Care | Payer: No Typology Code available for payment source | Source: Ambulatory Visit | Attending: Obstetrics and Gynecology | Admitting: Obstetrics and Gynecology

## 2021-06-30 ENCOUNTER — Ambulatory Visit: Payer: Self-pay | Admitting: *Deleted

## 2021-06-30 ENCOUNTER — Encounter (INDEPENDENT_AMBULATORY_CARE_PROVIDER_SITE_OTHER): Payer: Self-pay

## 2021-06-30 ENCOUNTER — Other Ambulatory Visit: Payer: Self-pay

## 2021-06-30 ENCOUNTER — Ambulatory Visit: Payer: Self-pay

## 2021-06-30 VITALS — BP 130/78 | Wt 138.9 lb

## 2021-06-30 DIAGNOSIS — Z1239 Encounter for other screening for malignant neoplasm of breast: Secondary | ICD-10-CM

## 2021-06-30 DIAGNOSIS — N644 Mastodynia: Secondary | ICD-10-CM

## 2021-06-30 DIAGNOSIS — Z1211 Encounter for screening for malignant neoplasm of colon: Secondary | ICD-10-CM

## 2021-06-30 NOTE — Progress Notes (Signed)
[name] is a 50 y.o. female who presents to [facility] clinic today with complaint of diffuse right breast pain that is greater within the outer breast x 3 weeks. Patient stated the pain was constant and has decreased over the past two weeks. Patient stated the pain comes and goes. Patient rated the pain at 9-10 out of 10 for the first week and stated today it is a 2-3 out of 10.     Pap Smear: Pap smear not completed today. Last Pap smear was in March/April 2021 at [facility] clinic and was normal per patient. Per patient has no history of an abnormal Pap smear. Last Pap smear result is not available in Epic.   Physical exam: Breasts Breasts symmetrical. No skin abnormalities bilateral breasts. No nipple retraction bilateral breasts. No nipple discharge bilateral breasts. No lymphadenopathy. No lumps palpated bilateral breasts. Complaints of right lower outer breast pain on exam.      Pelvic/Bimanual Pap is not indicated today per BCCCP guidelines.   Smoking History: Patient has never smoked.   Patient Navigation: Patient education provided. Access to services provided for patient through [facility] program.   Colorectal Cancer Screening: Per patient has never had colonoscopy completed. FIT Test given to patient to complete. No complaints today.    Breast and Cervical Cancer Risk Assessment: Patient does not have family history of breast cancer, known genetic mutations, or radiation treatment to the chest before age 38. Patient does not have history of cervical dysplasia, immunocompromised, or DES exposure in-utero.  Risk Assessment     Risk Scores       06/30/2021   Last edited by: Demetrius Revel, LPN   5-year risk: 0.4 %   Lifetime risk: 3.1 %            A: BCCCP exam without pap smear Complaint of right breast pain.  P: Referred patient to the [facility] for a diagnostic mammogram. Appointment scheduled Tuesday, June 30, 2021 at  1010.  Loletta Parish, RN 06/30/2021 8:45 AM

## 2021-06-30 NOTE — Patient Instructions (Signed)
Explained breast self awareness with Ashley Reyes. Patient did not need a Pap smear today due to last Pap smear was in March/April 2021 per patient. Let her know B[facility]CP will cover Pap smears every 3 years unless has a history of abnormal Pap smears. Referred patient to the [facility] for a diagnostic mammogram. Appointment scheduled Tuesday, June 30, 2021 at 1010. Patient aware of appointment and will be there. Ashley Reyes verbalized understanding.  Ashley Reyes, Arvil Chaco, RN 8:45 AM

## 2023-02-01 IMAGING — MG DIGITAL DIAGNOSTIC BILAT W/ TOMO W/ CAD
8 series · 9 of 24 positions shown · non-contrast
Comparison: Previous exam(s).

CLINICAL DATA: The patient has intermittent diffuse right breast
pain which has improved over the last couple of weeks.

EXAM:
DIGITAL DIAGNOSTIC BILATERAL MAMMOGRAM WITH TOMOSYNTHESIS AND CAD
TECHNIQUE: Bilateral digital diagnostic mammography and breast tomosynthesis
was performed. The images were evaluated with computer-aided
detection.

[R MLO synth-2D]
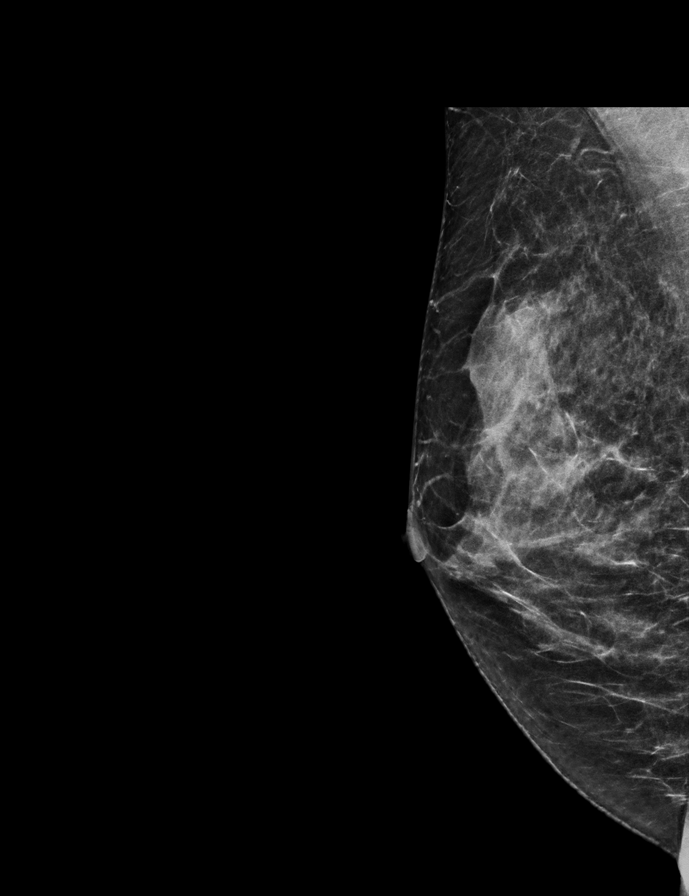

[L MLO synth-2D]
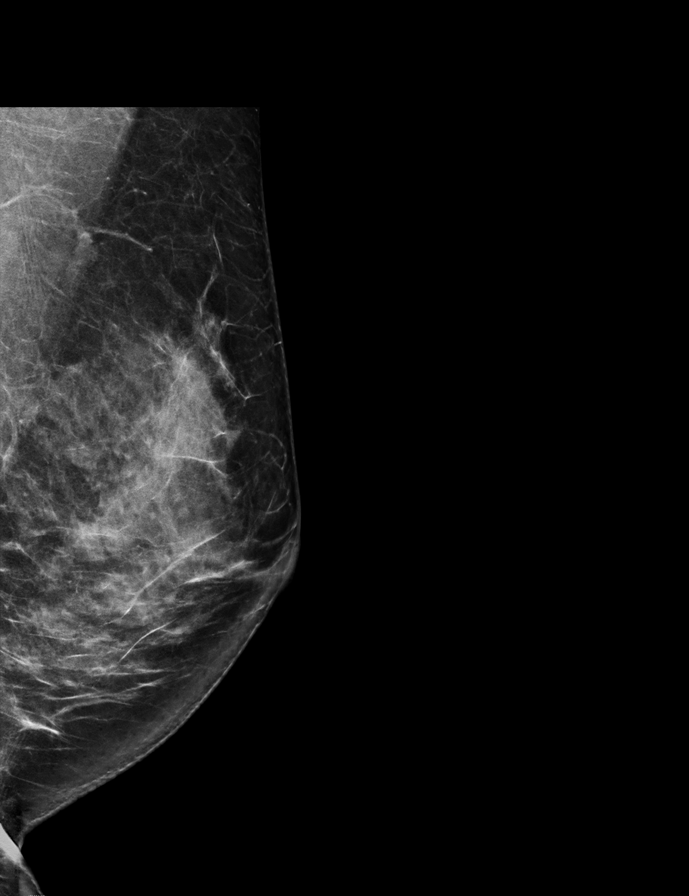

[L CC synth-2D]
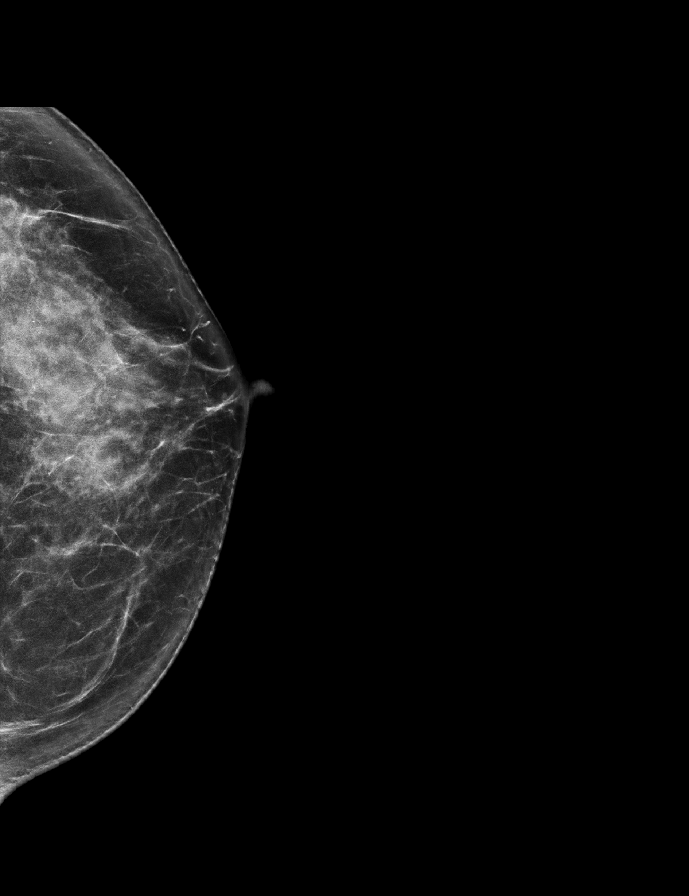

[R CC synth-2D]
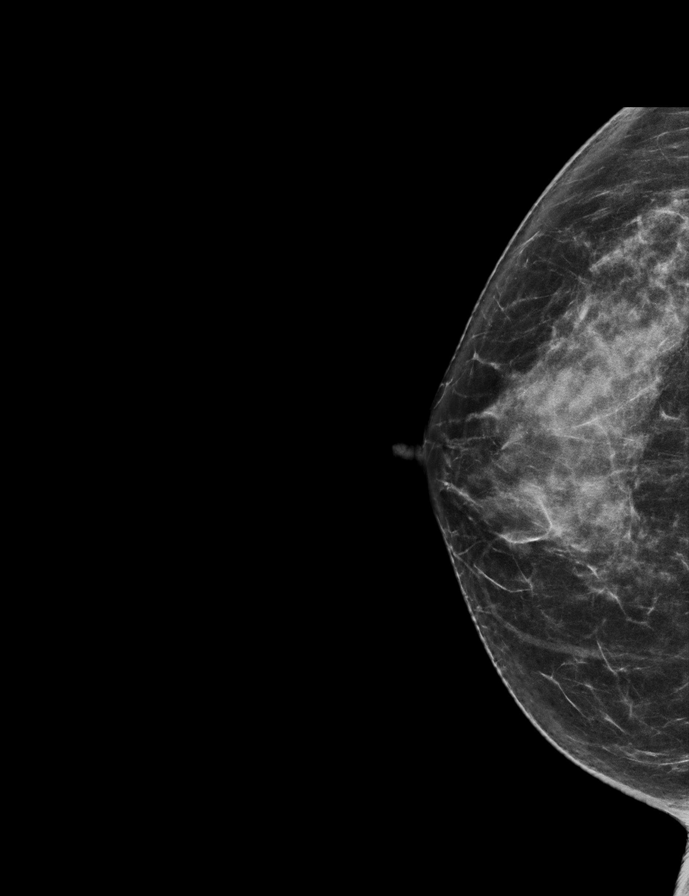

[R MLO tomo · 2 of 59 frames shown]
[frame 20/59]
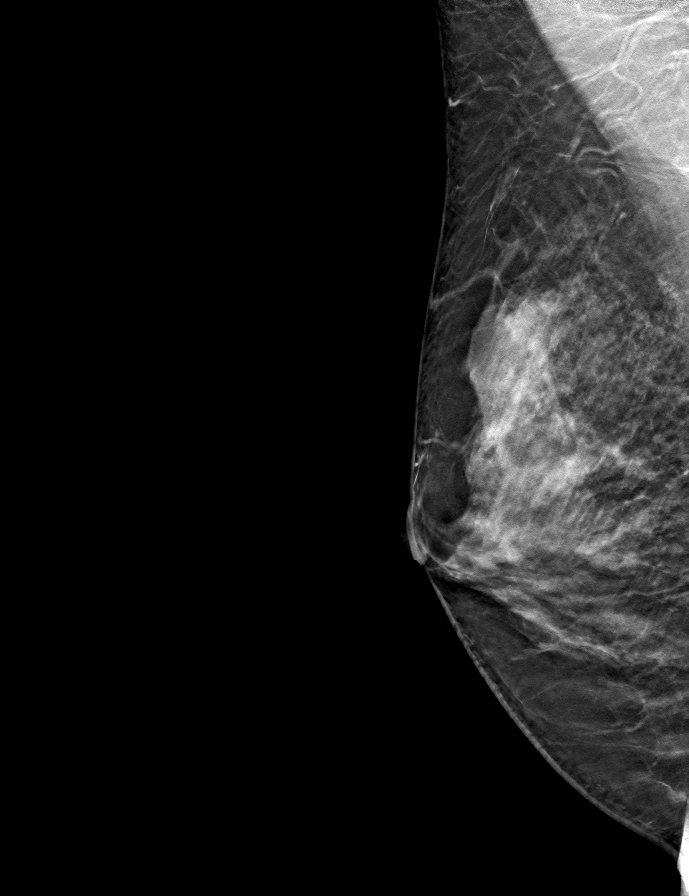
[frame 30/59]
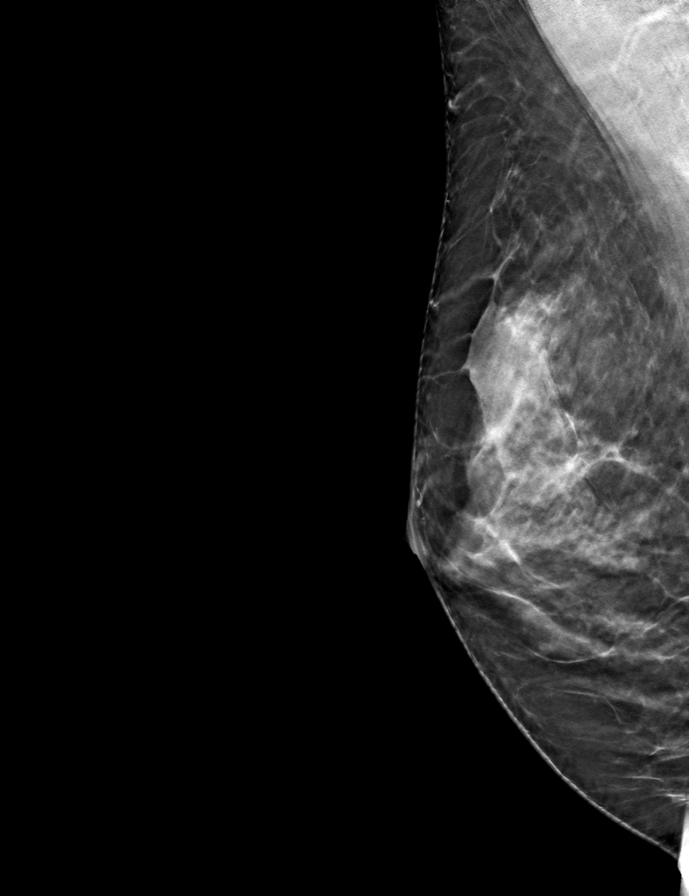

[L CC tomo · tomo slice 33/64.0]
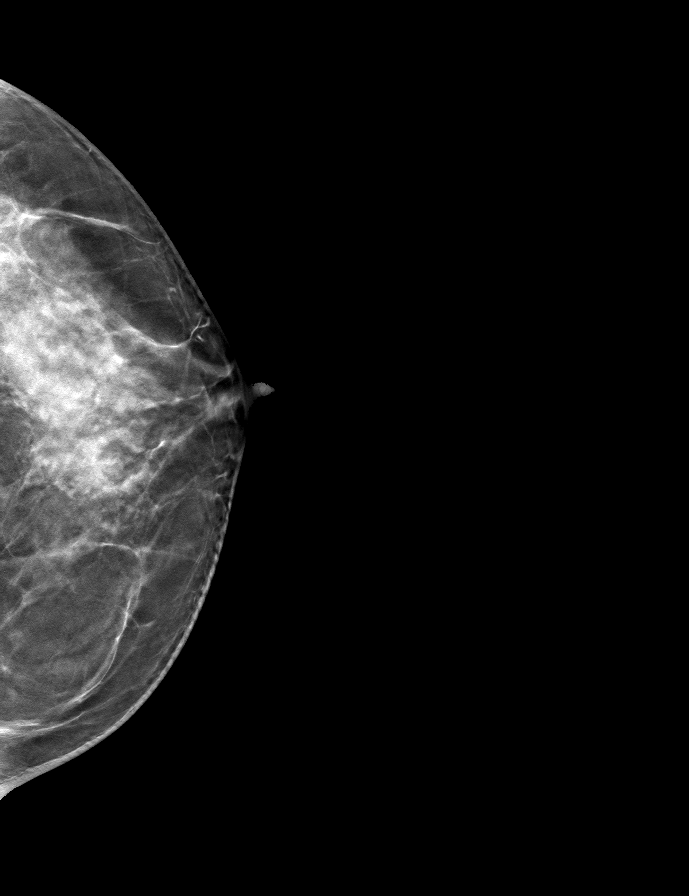

[R CC tomo · tomo slice 33/65.0]
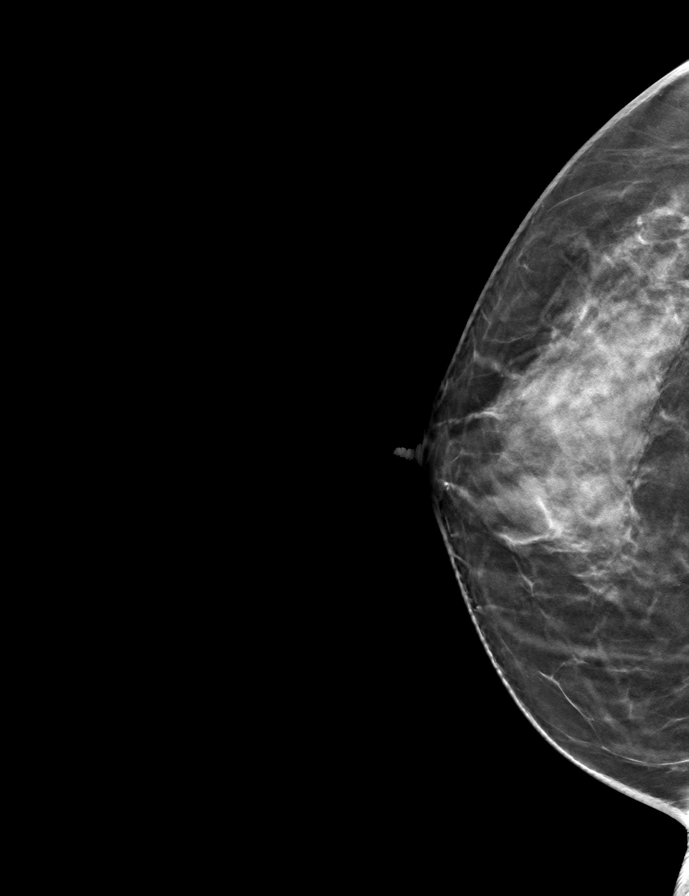

[L MLO tomo · tomo slice 35/68.0]
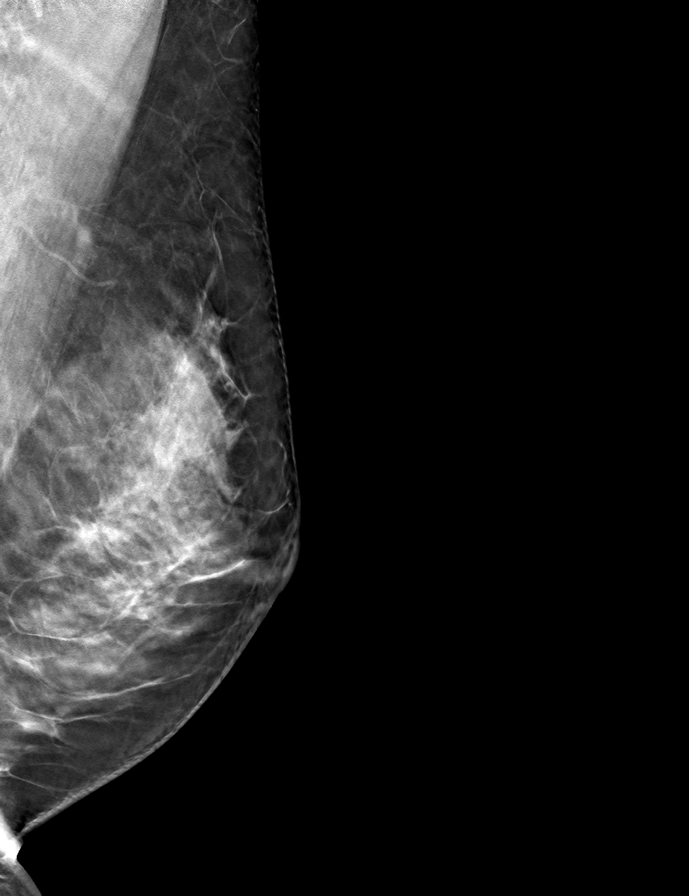

[9 of 24 positions shown; findings below may reference images not displayed]

ACR Breast Density Category c: The breast tissue is heterogeneously
dense, which may obscure small masses.
FINDINGS: No suspicious masses, calcifications, or distortion are identified
in either breast.
IMPRESSION: No mammographic evidence of malignancy.

RECOMMENDATION:
Treatment of the patient's symptoms should be based on clinical and
physical exam given lack of imaging findings. Recommend annual
screening mammography.

I have discussed the findings and recommendations with the patient.
If applicable, a reminder letter will be sent to the patient
regarding the next appointment.

BI-RADS CATEGORY  1: Negative.

## 2024-03-28 NOTE — Progress Notes (Deleted)
    Cardiology Office Note Date:  03/28/2024  ID:  Ashley Reyes, DOB 1971/07/17, MRN [id number] PCP:  Pcp, No  Cardiologist: Joelle VEAR Ren Donley, MD  No chief complaint on file.    Problems Palpitations  Visits  09/11:    History of Present Illness: Ashley Reyes is a 53 y.o. female who presents with palpitations.   ROS: Please see the history of present illness. All other systems are reviewed and negative.   Past Medical History:  Diagnosis Date   Cyst of uterus    Migraine headache    Ovarian cancer (HCC)     Past Surgical History:  Procedure Laterality Date   SALPINGECTOMY      Current Outpatient Medications  Medication Sig Dispense Refill   BALZIVA 0.4-35 MG-MCG tablet take 1 tablet by mouth once daily 28 tablet PRN   hydrOXYzine  (ATARAX /VISTARIL ) 25 MG tablet Take 1 tablet (25 mg total) by mouth every 8 (eight) hours as needed for anxiety. 12 tablet 0   No current facility-administered medications for this visit.    Allergies:   Patient has no known allergies.   Social History:  ***  Family History:  ***  PHYSICAL EXAM: VS:  There were no vitals taken for this visit. , BMI There is no height or weight on file to calculate BMI. GEN: Well nourished, well developed, in no acute distress HEENT: normal Neck: no JVD, carotid bruits, or masses Cardiac: ***RRR; no murmurs, rubs, or gallops,no edema  Respiratory:  CTAB bilaterally, normal work of breathing GI: soft, nontender, nondistended, + BS Extremities: No LE edema Skin: warm and dry, no rash Neuro:  Strength and sensation are intact  EKG: ***  Recent Labs: No results found for requested labs within last 365 days.      Component Value Date/Time   CHOL 154 02/12/2008 2019   TRIG 71 02/12/2008 2019   HDL 48 02/12/2008 2019   CHOLHDL 3.2 Ratio 02/12/2008 2019   VLDL 14 02/12/2008 2019   LDLCALC 92 02/12/2008 2019     Wt Readings from Last 5 Encounters:  06/30/21 138 lb 14.4 oz (63 kg)   04/23/16 133 lb (60.3 kg)  02/19/11 131 lb (59.4 kg)     BP Readings from Last 5 Encounters:  06/30/21 130/78  04/23/16 104/72  02/19/11 134/72    Studies: Reviewed  ASSESSMENT AND PLAN: Ashley Reyes is a 53 y.o. female who presents with palpitations.   #*** #*** #*** #***   Signed, Joelle VEAR Ren Donley, MD  03/28/2024 8:11 AM    [facility]

## 2024-03-29 ENCOUNTER — Ambulatory Visit: Payer: Self-pay | Attending: Cardiology

## 2024-04-05 ENCOUNTER — Encounter: Payer: Self-pay | Admitting: Student

## 2024-04-10 ENCOUNTER — Encounter: Payer: Self-pay | Admitting: Student

## 2024-04-20 ENCOUNTER — Encounter: Payer: Self-pay | Admitting: Student

## 2024-06-08 ENCOUNTER — Encounter

## 2024-07-23 ENCOUNTER — Ambulatory Visit (HOSPITAL_COMMUNITY): Payer: Self-pay

## 2024-07-23 ENCOUNTER — Encounter (HOSPITAL_COMMUNITY): Payer: Self-pay

## 2024-07-23 ENCOUNTER — Ambulatory Visit (HOSPITAL_COMMUNITY)
Admission: EM | Admit: 2024-07-23 | Discharge: 2024-07-23 | Disposition: A | Discharge status: Home / Self Care | Attending: Family Medicine | Admitting: Family Medicine

## 2024-07-23 DIAGNOSIS — F419 Anxiety disorder, unspecified: Secondary | ICD-10-CM | POA: Insufficient documentation

## 2024-07-23 DIAGNOSIS — R079 Chest pain, unspecified: Secondary | ICD-10-CM | POA: Insufficient documentation

## 2024-07-23 LAB — CBC WITH DIFFERENTIAL/PLATELET
Abs Immature Granulocytes: 0.02 K/uL (ref 0.00–0.07)
Basophils Absolute: 0 K/uL (ref 0.0–0.1)
Basophils Relative: 1 %
Eosinophils Absolute: 0 K/uL (ref 0.0–0.5)
Eosinophils Relative: 1 %
HCT: 39.8 % (ref 36.0–46.0)
Hemoglobin: 12.9 g/dL (ref 12.0–15.0)
Immature Granulocytes: 0 %
Lymphocytes Relative: 40 %
Lymphs Abs: 2.9 K/uL (ref 0.7–4.0)
MCH: 26.4 pg (ref 26.0–34.0)
MCHC: 32.4 g/dL (ref 30.0–36.0)
MCV: 81.6 fL (ref 80.0–100.0)
Monocytes Absolute: 0.4 K/uL (ref 0.1–1.0)
Monocytes Relative: 5 %
Neutro Abs: 4 K/uL (ref 1.7–7.7)
Neutrophils Relative %: 53 %
Platelets: 341 K/uL (ref 150–400)
RBC: 4.88 MIL/uL (ref 3.87–5.11)
RDW: 13.3 % (ref 11.5–15.5)
WBC: 7.3 K/uL (ref 4.0–10.5)
nRBC: 0 % (ref 0.0–0.2)

## 2024-07-23 LAB — COMPREHENSIVE METABOLIC PANEL WITH GFR
ALT: 28 U/L (ref 0–44)
AST: 24 U/L (ref 15–41)
Albumin: 4.8 g/dL (ref 3.5–5.0)
Alkaline Phosphatase: 109 U/L (ref 38–126)
Anion gap: 12 (ref 5–15)
BUN: 10 mg/dL (ref 6–20)
CO2: 26 mmol/L (ref 22–32)
Calcium: 10.2 mg/dL (ref 8.9–10.3)
Chloride: 102 mmol/L (ref 98–111)
Creatinine, Ser: 0.59 mg/dL (ref 0.44–1.00)
GFR, Estimated: >60 mL/min
Glucose, Bld: 91 mg/dL (ref 70–99)
Potassium: 4.7 mmol/L (ref 3.5–5.1)
Sodium: 140 mmol/L (ref 135–145)
Total Bilirubin: 0.5 mg/dL (ref 0.0–1.2)
Total Protein: 8.7 g/dL — ABNORMAL HIGH (ref 6.5–8.1)

## 2024-07-23 MED ORDER — HYDROXYZINE HCL 25 MG PO TABS: 25.0000 mg | ORAL_TABLET | Freq: Three times a day (TID) | ORAL | 0 refills | Status: AC | PRN

## 2024-07-23 MED ORDER — CYCLOBENZAPRINE HCL 10 MG PO TABS: 10.0000 mg | ORAL_TABLET | Freq: Two times a day (BID) | ORAL | 0 refills | Status: AC | PRN

## 2024-07-23 NOTE — ED Provider Notes (Signed)
 " [facility]    CSN: [id number] Arrival date & time: 07/23/24  1042      History   Chief Complaint Chief Complaint  Patient presents with   Chest Pain   Dizziness    HPI Ashley Reyes is a 54 y.o. female.   This 54 year old female is being seen for complaints of chest tightness, intermittent dizziness, and tightness across her shoulders.  She reports having an anxiety attack last Friday and Sunday.  She has previously taken medications for anxiety attacks, but has not had an anxiety attack in 3 years.  She has run out of medication.  She endorses increased stress with work and family.  She says she becomes dizzy when walking, but it resolves when she sits down.  She denies headache, vision changes.  She denies shortness of breath, abdominal pain, nausea, vomiting, diarrhea.   Chest Pain Associated symptoms: dizziness   Associated symptoms: no abdominal pain, no fever, no headache, no nausea, no palpitations, no shortness of breath and no vomiting   Dizziness Associated symptoms: chest pain   Associated symptoms: no diarrhea, no headaches, no nausea, no palpitations, no shortness of breath and no vomiting     Past Medical History:  Diagnosis Date   Cyst of uterus    Migraine headache    Ovarian cancer [facility])     Patient Active Problem List   Diagnosis Date Noted   Abnormal uterine bleeding 01/21/2011    Past Surgical History:  Procedure Laterality Date   SALPINGECTOMY      OB History     Gravida  2   Para  2   Term  2   Preterm      AB      Living  2      SAB      IAB      Ectopic      Multiple      Live Births               Home Medications    Prior to Admission medications  Medication Sig Start Date End Date Taking? Authorizing Provider  BALZIVA 0.4-35 MG-MCG tablet take 1 tablet by mouth once daily Patient not taking: Reported on 07/23/2024 01/21/12   Fredirick Glenys RAMAN, MD  hydrOXYzine  (ATARAX /VISTARIL ) 25 MG tablet Take 1  tablet (25 mg total) by mouth every 8 (eight) hours as needed for anxiety. Patient not taking: Reported on 07/23/2024 04/23/16   Nivia Colon, PA-C    Family History Family History  Problem Relation Age of Onset   Hypertension Mother     Social History Social History[1]   Allergies   Patient has no known allergies.   Review of Systems Review of Systems  Constitutional:  Negative for activity change, appetite change, chills and fever.  Respiratory:  Positive for chest tightness. Negative for shortness of breath.   Cardiovascular:  Positive for chest pain. Negative for palpitations.  Gastrointestinal:  Negative for abdominal pain, diarrhea, nausea and vomiting.  Musculoskeletal:  Positive for myalgias.  Skin:  Negative for color change.  Neurological:  Positive for dizziness. Negative for headaches.  Psychiatric/Behavioral:  The patient is nervous/anxious.   All other systems reviewed and are negative.    Physical Exam Triage Vital Signs ED Triage Vitals  Encounter Vitals Group     BP 07/23/24 1116 (!) 137/94     Girls Systolic BP Percentile --      Girls Diastolic BP Percentile --  Boys Systolic BP Percentile --      Boys Diastolic BP Percentile --      Pulse Rate 07/23/24 1116 92     Resp 07/23/24 1116 16     Temp 07/23/24 1116 98 F (36.7 C)     Temp Source 07/23/24 1116 Oral     SpO2 07/23/24 1116 100 %     Weight --      Height --      Head Circumference --      Peak Flow --      Pain Score 07/23/24 1113 8     Pain Loc --      Pain Education --      Exclude from Growth Chart --    Orthostatic VS for the past 24 hrs:  BP- Lying Pulse- Lying BP- Sitting Pulse- Sitting BP- Standing at 0 minutes Pulse- Standing at 0 minutes  07/23/24 1252 117/80 72 104/72 72 128/86 82    Updated Vital Signs BP (!) 137/94 (BP Location: Right Arm)   Pulse 92   Temp 98 F (36.7 C) (Oral)   Resp 16   SpO2 100%   Visual Acuity Right Eye Distance:   Left Eye Distance:    Bilateral Distance:    Right Eye Near:   Left Eye Near:    Bilateral Near:     Physical Exam Vitals and nursing note reviewed.  Constitutional:      General: She is not in acute distress.    Appearance: She is well-developed.     Comments: Pleasant female appearing stated age found sitting in chair in no acute distress.  HENT:     Head: Normocephalic and atraumatic.     Mouth/Throat:     Lips: Pink.  Eyes:     Conjunctiva/sclera: Conjunctivae normal.  Cardiovascular:     Rate and Rhythm: Normal rate and regular rhythm.     Heart sounds: Normal heart sounds. No murmur heard. Pulmonary:     Effort: Pulmonary effort is normal. No respiratory distress.     Breath sounds: Normal breath sounds.  Abdominal:     General: Bowel sounds are normal.     Palpations: Abdomen is soft.     Tenderness: There is no abdominal tenderness.  Musculoskeletal:     Cervical back: Neck supple.  Skin:    General: Skin is warm and dry.     Capillary Refill: Capillary refill takes less than 2 seconds.  Neurological:     Mental Status: She is alert.  Psychiatric:        Attention and Perception: Attention normal.        Mood and Affect: Mood normal.        Speech: Speech normal.        Behavior: Behavior normal.        Thought Content: Thought content normal.        Cognition and Memory: Cognition normal.      UC Treatments / Results  Labs (all labs ordered are listed, but only abnormal results are displayed) Labs Reviewed  CBC WITH DIFFERENTIAL/PLATELET  COMPREHENSIVE METABOLIC PANEL WITH GFR    EKG   Radiology DG Chest 2 View Result Date: 07/23/2024 EXAM: 2 VIEW(S) XRAY OF THE CHEST 07/23/2024 01:09:19 PM COMPARISON: None available. CLINICAL HISTORY: chest pain FINDINGS: LUNGS AND PLEURA: No focal pulmonary opacity. No pleural effusion. No pneumothorax. HEART AND MEDIASTINUM: No acute abnormality of the cardiac and mediastinal silhouettes. BONES AND SOFT TISSUES: No acute osseous  abnormality. IMPRESSION: 1. No acute cardiopulmonary pathology. Electronically signed by: Norleen Boxer MD 07/23/2024 01:53 PM EST RP Workstation: [id number]    Procedures Procedures (including critical care time)  Medications Ordered in UC Medications - No data to display  Initial Impression / Assessment and Plan / UC Course  I have reviewed the triage vital signs and the nursing notes.  Pertinent labs & imaging results that were available during my care of the patient were reviewed by me and considered in my medical decision making (see chart for details).     Vitals and triage reviewed, patient is hemodynamically stable.  EKG normal.  Chest x-ray obtained and negative for acute findings.  Orthostatic vital signs obtained, negative.  CBC, CMP obtained to rule out anemia, electrolyte imbalances.  She will be notified of any abnormalities.  Suspect musculoskeletal pain including chest and across shoulders.  Prescription given for cyclobenzaprine .  She presents with complaints of increasing anxiety.  Prescription for hydroxyzine  given.  She is provided information for behavioral health urgent care.  Plan of care, follow-up care, return precautions given, no questions at this time. Final Clinical Impressions(s) / UC Diagnoses   Final diagnoses:  Chest pain, unspecified type  Anxiety     Discharge Instructions      It is unclear what is causing your chest discomfort and your evaluation has shown no signs of medical conditions requiring emergent intervention at this time. This is very reassuring! Your discomfort may be related to pain in the muscles and bones in your chest.   You may use over the counter tylenol and/or ibuprofen as needed for pain.  Apply heat to the chest wall 20 minutes on 20 minutes off and perform gentle stretches to the area. You may take muscle relaxer as prescribed but do not drink alcohol or drive while taking this since it can make you sleepy. Mainly take this at  nighttime to help with symptoms.  I have prescribed hydroxyzine .  Take 1 tablet every 8 hours as needed for anxiety.  This medication can make you sleepy, so please do not drive or operate machinery while taking.  I have given you a handout for behavioral health urgent care.  This facility is open 24 hours a day 7 days a week and they accept walk-ins.  If your symptoms do not improve in the next 2-3 days with interventions, please return.. Return to the Emergency Department if you experience worsening or uncontrolled chest pain, shortness of breath, light headedness, feeling faint, nausea, vomiting, or any other concerning symptoms. Otherwise, schedule a follow-up appointment with your PCP for follow-up.       ED Prescriptions   None    PDMP not reviewed this encounter.     [1]  Social History Tobacco Use   Smoking status: Never   Smokeless tobacco: Never  Vaping Use   Vaping status: Never Used  Substance Use Topics   Alcohol use: No   Drug use: No     Lennice Jon BROCKS, FNP 07/23/24 1405  "

## 2024-07-23 NOTE — Discharge Instructions (Addendum)
 It is unclear what is causing your chest discomfort and your evaluation has shown no signs of medical conditions requiring emergent intervention at this time. This is very reassuring! Your discomfort may be related to pain in the muscles and bones in your chest.   You may use over the counter tylenol and/or ibuprofen as needed for pain.  Apply heat to the chest wall 20 minutes on 20 minutes off and perform gentle stretches to the area. You may take muscle relaxer as prescribed but do not drink alcohol or drive while taking this since it can make you sleepy. Mainly take this at nighttime to help with symptoms.  I have prescribed hydroxyzine .  Take 1 tablet every 8 hours as needed for anxiety.  This medication can make you sleepy, so please do not drive or operate machinery while taking.  I have given you a handout for behavioral health urgent care.  This facility is open 24 hours a day 7 days a week and they accept walk-ins.  If your symptoms do not improve in the next 2-3 days with interventions, please return.. Return to the Emergency Department if you experience worsening or uncontrolled chest pain, shortness of breath, light headedness, feeling faint, nausea, vomiting, or any other concerning symptoms. Otherwise, schedule a follow-up appointment with your PCP for follow-up.

## 2024-07-23 NOTE — ED Triage Notes (Signed)
 Pt states had an anxiety attach last Friday and Sunday. States dizziness and stiff neck since. Pt c/o rt side chest tenderness with touching since last night. States hasn't had an anxiety attack in over 3 yrs and doesn't have any meds anymore.

## 2024-07-24 ENCOUNTER — Ambulatory Visit (HOSPITAL_COMMUNITY): Payer: Self-pay
